# Patient Record
Sex: Male | Born: 1960 | Race: White | Hispanic: No | Marital: Married | Smoking: Former smoker
Health system: Southern US, Community
[De-identification: ages and names within clinical notes are randomized; demographics above are authoritative.]

## PROBLEM LIST (undated history)

## (undated) DIAGNOSIS — D869 Sarcoidosis, unspecified: Secondary | ICD-10-CM

## (undated) DIAGNOSIS — K5190 Ulcerative colitis, unspecified, without complications: Secondary | ICD-10-CM

## (undated) DIAGNOSIS — M459 Ankylosing spondylitis of unspecified sites in spine: Secondary | ICD-10-CM

## (undated) DIAGNOSIS — F319 Bipolar disorder, unspecified: Secondary | ICD-10-CM

## (undated) DIAGNOSIS — E119 Type 2 diabetes mellitus without complications: Secondary | ICD-10-CM

## (undated) DIAGNOSIS — E785 Hyperlipidemia, unspecified: Secondary | ICD-10-CM

## (undated) HISTORY — DX: Bipolar disorder, unspecified: F31.9

## (undated) HISTORY — DX: Ankylosing spondylitis of unspecified sites in spine: M45.9

## (undated) HISTORY — DX: Ulcerative colitis, unspecified, without complications: K51.90

## (undated) HISTORY — DX: Sarcoidosis, unspecified: D86.9

## (undated) HISTORY — PX: BRONCHOSCOPY: SUR163

## (undated) HISTORY — DX: Type 2 diabetes mellitus without complications: E11.9

## (undated) HISTORY — DX: Hyperlipidemia, unspecified: E78.5

---

## 2004-02-29 ENCOUNTER — Encounter: Payer: Self-pay | Admitting: Pulmonary Disease

## 2004-03-14 ENCOUNTER — Encounter: Payer: Self-pay | Admitting: Pulmonary Disease

## 2004-06-02 ENCOUNTER — Encounter: Admission: RE | Admit: 2004-06-02 | Discharge: 2004-06-02 | Payer: Self-pay | Admitting: Rheumatology

## 2004-09-02 ENCOUNTER — Encounter: Admission: RE | Admit: 2004-09-02 | Discharge: 2004-09-02 | Payer: Self-pay | Admitting: Rheumatology

## 2004-09-05 ENCOUNTER — Encounter: Payer: Self-pay | Admitting: Pulmonary Disease

## 2004-09-05 ENCOUNTER — Encounter: Admission: RE | Admit: 2004-09-05 | Discharge: 2004-09-05 | Payer: Self-pay | Admitting: Rheumatology

## 2004-09-19 ENCOUNTER — Ambulatory Visit: Payer: Self-pay | Admitting: Pulmonary Disease

## 2004-09-21 ENCOUNTER — Ambulatory Visit: Admission: RE | Admit: 2004-09-21 | Discharge: 2004-09-21 | Payer: Self-pay | Admitting: Pulmonary Disease

## 2004-09-21 ENCOUNTER — Ambulatory Visit: Payer: Self-pay | Admitting: Pulmonary Disease

## 2004-09-21 ENCOUNTER — Encounter (INDEPENDENT_AMBULATORY_CARE_PROVIDER_SITE_OTHER): Payer: Self-pay | Admitting: Specialist

## 2004-10-12 ENCOUNTER — Ambulatory Visit (HOSPITAL_COMMUNITY): Admission: RE | Admit: 2004-10-12 | Discharge: 2004-10-12 | Payer: Self-pay | Admitting: Thoracic Surgery

## 2004-10-12 ENCOUNTER — Encounter: Payer: Self-pay | Admitting: Pulmonary Disease

## 2004-10-12 ENCOUNTER — Encounter (INDEPENDENT_AMBULATORY_CARE_PROVIDER_SITE_OTHER): Payer: Self-pay | Admitting: *Deleted

## 2004-10-20 ENCOUNTER — Ambulatory Visit: Payer: Self-pay | Admitting: Pulmonary Disease

## 2005-05-22 ENCOUNTER — Ambulatory Visit: Payer: Self-pay | Admitting: Pulmonary Disease

## 2007-02-11 ENCOUNTER — Ambulatory Visit: Payer: Self-pay | Admitting: Pulmonary Disease

## 2007-09-19 ENCOUNTER — Telehealth (INDEPENDENT_AMBULATORY_CARE_PROVIDER_SITE_OTHER): Payer: Self-pay | Admitting: *Deleted

## 2007-10-18 ENCOUNTER — Encounter: Payer: Self-pay | Admitting: Pulmonary Disease

## 2007-10-18 DIAGNOSIS — M459 Ankylosing spondylitis of unspecified sites in spine: Secondary | ICD-10-CM | POA: Insufficient documentation

## 2007-10-18 DIAGNOSIS — K219 Gastro-esophageal reflux disease without esophagitis: Secondary | ICD-10-CM | POA: Insufficient documentation

## 2007-10-18 DIAGNOSIS — L509 Urticaria, unspecified: Secondary | ICD-10-CM | POA: Insufficient documentation

## 2007-11-03 ENCOUNTER — Encounter: Admission: RE | Admit: 2007-11-03 | Discharge: 2007-11-03 | Payer: Self-pay | Admitting: Rheumatology

## 2008-01-30 ENCOUNTER — Encounter: Payer: Self-pay | Admitting: Pulmonary Disease

## 2008-01-30 ENCOUNTER — Ambulatory Visit: Payer: Self-pay | Admitting: Pulmonary Disease

## 2008-01-30 DIAGNOSIS — D869 Sarcoidosis, unspecified: Secondary | ICD-10-CM | POA: Insufficient documentation

## 2008-03-12 ENCOUNTER — Ambulatory Visit: Payer: Self-pay | Admitting: Pulmonary Disease

## 2008-03-15 ENCOUNTER — Encounter: Payer: Self-pay | Admitting: Pulmonary Disease

## 2009-01-20 ENCOUNTER — Observation Stay (HOSPITAL_COMMUNITY): Admission: EM | Admit: 2009-01-20 | Discharge: 2009-01-20 | Payer: Self-pay | Admitting: Emergency Medicine

## 2009-01-20 ENCOUNTER — Encounter: Payer: Self-pay | Admitting: Pulmonary Disease

## 2009-01-20 ENCOUNTER — Ambulatory Visit: Payer: Self-pay | Admitting: Cardiology

## 2009-01-20 IMAGING — CT CT HEART MORP W/ CTA COR W/ SCORE W/ CA W/CM &/OR W/O CM
1 series · 1 of 1 positions shown, 2 images · non-contrast
Comparison: none

Addendum Begins

There is hilar and mediastinal adenopathy, decreased from the prior
CT scan dated 09/05/2004.  There is no discrete nodularity in the
lower lobes.  There were two small small nodules in the left lower
lobe on the prior exam.  The bony structures are normal.  The
visualized portion of the upper abdomen is normal.
There is no pericardial disease.
PROTOCOL: The patient scanned on a Siemens sensations 64 slice
scanner.  Gantry rotation speed was 320 milliseconds.  Collimation
was [DATE] mm . Reconstruction overlap was [DATE] mm.   100
mg p.o. and 10 mg IV  of Lopressor was administered.  Average heart
rate during the scan was  beats per minute.  After an initial AP
and lateral topogram, 3 mm axial slices were performed through the
heart for calcium scoring.  The patient then received 20 ml of
contrast for a timing bolus with a region of interest in the
ascending aorta.  A delay of 20 seconds was used.  The patient then
had  80 ml of contrast given for coronary CTA.  The 3-D data set
was then sent to the [employer] workstation.  Reconstructions were
done using MIP,MPR and VRT modes.

[Series 606: monochrome 8bit · 1 of 1 slices shown, 2 images]
[im 1/1  vessel]
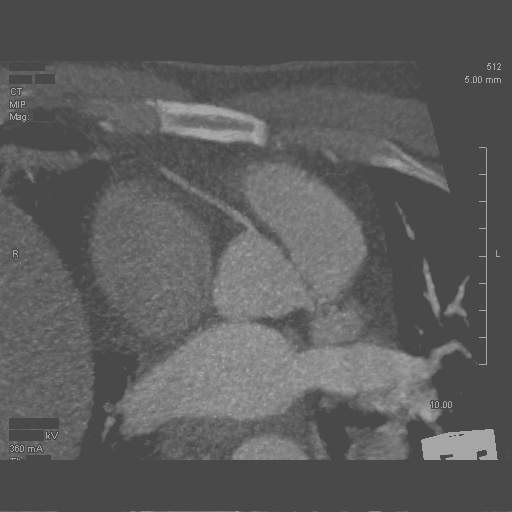
[im 1/1  lung]
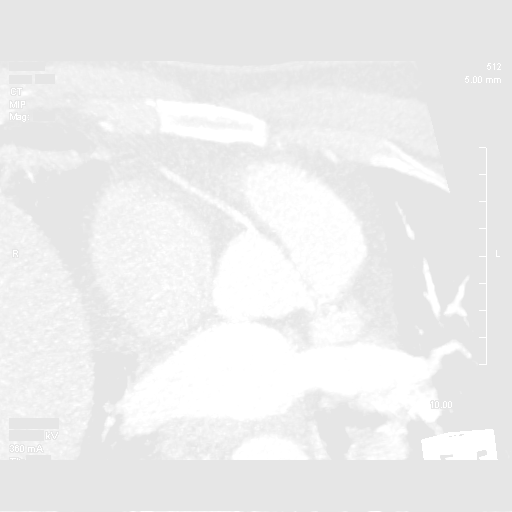

[1 of 1 positions shown; findings below may reference images not displayed]

IMPRESSION: Hilar and mediastinal adenopathy consistent with
sarcoidosis, diminished since the prior study.  Resolution of small
pulmonary nodules at the left lung base since the prior exam.

Addendum Ends
CARDIAC CTA WITH CALCIUM SCORE 01/20/2009 [DATE]

Ordering Physician: Blondinacka

[place] Physician: Higinus.Danii, M.D.
Indications: Chest pain

DETAILED FINDINGS:

Quality of Study: Fair

Left Main: Normal.

Left Anterior Descending: There are no significant stenoses.  There
is some degradation of the images due to pixelation.

Left Circumflex: The circumflex is widely patent with a large
obtuse marginal which is also widely patent.  The circumflex is
dominant and supplies the PDA.

Right Coronary Artery: There are no significant stenoses.  The scan
quality is suboptimal with the pixalation artifact.

Ventricular Function/Wall Motion: Normal.

LV Ejection Fraction: The ejection fraction appears normal.
Calculation software was nonfunctional.

Left Atrium, Right Atrium, RV Size: There is no chamber
enlargement.

Pericardium: Normal.  No pericardial effusion or pericardial
thickening.

Coronary Calcium Score: Agatston Score 5.02, percentile 50-75%

Aorta: The descending thoracic aorta is normal with a maximal
diameter of 2.7 cm.  The descending thoracic aorta has a maximal
diameter of 2.3 cm.

Other: The patient has hilar mediastinal adenopathy.  He has a
history of sarcoidosis and this is felt to be responsible for the
adenopathy.
IMPRESSION: There is no discrete coronary artery disease.  However, the study
is suboptimal due to pixelation of the images.

## 2009-01-22 DIAGNOSIS — R079 Chest pain, unspecified: Secondary | ICD-10-CM | POA: Insufficient documentation

## 2009-01-25 ENCOUNTER — Ambulatory Visit: Payer: Self-pay

## 2009-01-25 ENCOUNTER — Encounter: Payer: Self-pay | Admitting: Cardiology

## 2009-01-26 ENCOUNTER — Ambulatory Visit: Payer: Self-pay | Admitting: Pulmonary Disease

## 2009-04-20 ENCOUNTER — Ambulatory Visit: Payer: Self-pay | Admitting: Cardiology

## 2010-10-01 LAB — POCT CARDIAC MARKERS
CKMB, poc: <1 ng/mL — ABNORMAL LOW (ref 1.0–8.0)
CKMB, poc: <1 ng/mL — ABNORMAL LOW (ref 1.0–8.0)
CKMB, poc: <1 ng/mL — ABNORMAL LOW (ref 1.0–8.0)
Myoglobin, poc: 35.3 ng/mL (ref 12–200)
Myoglobin, poc: 53.4 ng/mL (ref 12–200)
Myoglobin, poc: 76.7 ng/mL (ref 12–200)
Troponin i, poc: <0.05 ng/mL (ref 0.00–0.09)
Troponin i, poc: <0.05 ng/mL (ref 0.00–0.09)
Troponin i, poc: <0.05 ng/mL (ref 0.00–0.09)

## 2010-10-01 LAB — DIFFERENTIAL
Basophils Absolute: 0.1 10*3/uL (ref 0.0–0.1)
Basophils Relative: 1 % (ref 0–1)
Eosinophils Absolute: 0.3 10*3/uL (ref 0.0–0.7)
Eosinophils Relative: 3 % (ref 0–5)
Lymphocytes Relative: 14 % (ref 12–46)
Lymphs Abs: 1.3 10*3/uL (ref 0.7–4.0)
Monocytes Absolute: 0.7 10*3/uL (ref 0.1–1.0)
Monocytes Relative: 7 % (ref 3–12)
Neutro Abs: 6.9 10*3/uL (ref 1.7–7.7)
Neutrophils Relative %: 75 % (ref 43–77)

## 2010-10-01 LAB — BASIC METABOLIC PANEL
BUN: 21 mg/dL (ref 6–23)
CO2: 26 mEq/L (ref 19–32)
Calcium: 9.4 mg/dL (ref 8.4–10.5)
Chloride: 101 mEq/L (ref 96–112)
Creatinine, Ser: 1.14 mg/dL (ref 0.4–1.5)
GFR calc Af Amer: >60 mL/min (ref >60)
GFR calc non Af Amer: >60 mL/min (ref >60)
Glucose, Bld: 138 mg/dL — ABNORMAL HIGH (ref 70–99)
Potassium: 3.8 mEq/L (ref 3.5–5.1)
Sodium: 135 mEq/L (ref 135–145)

## 2010-10-01 LAB — CBC
HCT: 41.6 % (ref 39.0–52.0)
Hemoglobin: 14.2 g/dL (ref 13.0–17.0)
MCHC: 34.2 g/dL (ref 30.0–36.0)
MCV: 92.8 fL (ref 78.0–100.0)
Platelets: 304 10*3/uL (ref 150–400)
RBC: 4.48 MIL/uL (ref 4.22–5.81)
RDW: 12.8 % (ref 11.5–15.5)
WBC: 9.2 10*3/uL (ref 4.0–10.5)

## 2010-10-01 LAB — D-DIMER, QUANTITATIVE: D-Dimer, Quant: 0.24 ug/mL-FEU (ref 0.00–0.48)

## 2010-11-07 NOTE — Consult Note (Signed)
NAME:  Dale Curtis, Dale Curtis               ACCOUNT NO.:  [id number]   MEDICAL RECORD NO.:  [id number]          PATIENT TYPE:  OBV   LOCATION:  1858                         FACILITY:  [facility]   PHYSICIAN:  Arturo Morton. Riley Kill, MD, FACCDATE OF BIRTH:  [birth year]   DATE OF CONSULTATION:  01/20/2009  DATE OF DISCHARGE:                                 CONSULTATION   PRIMARY CARE PHYSICIAN:  Feliciana Rossetti, MD   PULMONOLOGIST:  Barbaraann Share, MD,FCCP   CARDIOLOGIST:  None.   CHIEF COMPLAINT:  Chest pain.   HISTORY OF PRESENT ILLNESS:  Mr. Brathwaite is a 50 year old male with no  previous history of coronary artery disease.  He had chest pain several  years ago and had a stress test in [place] that was reportedly okay.  He was in his usual state of health until December 25, 2008.  He took his  usual weekly shot of Enbrel.  Later that day while traveling, he had  chest pain that he described as cramping and tightness.  It reached a  3/10.  He had some nausea with it, but no shortness of breath, vomiting,  or diaphoresis.  The symptoms resolved spontaneously.  Over the weekend,  he had 3 or 4 episodes.  He started taking Tums for the last few  episodes, which helped.  The symptoms resolved completely until the January 18, 2009.  When his symptoms returned on January 18, 2009, he saw his  primary care physician who sent him to the ER at [facility].  There, his EKG was normal and cardiac enzymes were also normal.  Those  symptoms resolved spontaneously.  The symptoms returned yesterday.  He  had been out on the yard doing work, but feels that the activity was not  very strenuous.  Although, it was hot and he was outside for a prolonged  period of time.  He had no symptoms.  He ate part of a piece of chicken  and drank a Coke after he came back inside and cooled off.  His symptoms  returned at 3/10.  He came to the [facility] ER where he began belching  and stated that the belching relieved the chest pain.   Then, he  developed burning chest pain, which was relieved by GI cocktail.  He has  remained symptom-free.  He has had no exertional symptoms and feels that  he does exert himself on a regular basis through work and sports.   PAST MEDICAL HISTORY:  He has family history of coronary artery disease  and a remote history of tobacco use.  He has pulmonary sarcoid, but PFTs  were normal in September 2009.  Several years ago, he had a stress test  at [facility] because of chest pain that was reportedly okay.  He also has a history of ankylosing spondylitis.  He has a history of  gastroesophageal reflux disease and that was chest tightness which  resolved when he quit coffee several years ago.   SURGICAL HISTORY:  Status post mediastinoscopy and bronchoscopy.   ALLERGIES:  No known drug allergies.  MEDICATIONS:  Enbrel weekly.   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  He has an  approximately 10-pack-a-year history of tobacco use and quit in 2006.  He has no history of alcohol or drug abuse.  He exercises regularly with  wrestling and other activities and works around the house.   FAMILY HISTORY:  His mother died at 26 with COPD, but also had an MI.  His father is alive at age 73 with a history of coronary artery disease  and AAA.  He has no siblings with cardiac issues.   REVIEW OF SYSTEMS:  Full 14-point review of systems is negative except  for occasional arthralgias and reflux symptoms as well as the symptoms  described in the HPI.   PHYSICAL EXAMINATION:  VITAL SIGNS:  Temperature is 98, blood pressure  118/75, pulse 61, respiratory rate 18, O2 saturation 98% on room air.  GENERAL:  He is a well-developed, well-nourished white male in no acute  distress.  HEENT:  Normal.  Neck:  There is no lymphadenopathy, thyromegaly, bruit, or JVD noted.  CVA:  His heart is regular in rate and  rhythm with an S1 and S2 and no  significant murmur, rub, or gallop is noted.  Distal pulses are intact  in all four extremities.  LUNGS:  Essentially clear to auscultation bilaterally.  SKIN:  No rashes or lesions are noted.  ABDOMEN:  Soft and nontender with active bowel sounds.  EXTREMITIES:  There is no cyanosis, clubbing, or edema noted.  MUSCULOSKELETAL:  There is no joint deformity or effusions and no spine  or CVA tenderness.  NEUROLOGIC:  He is alert and oriented.  Cranial nerves II-XII grossly  intact.   Chest x-ray shows stable chest with sarcoid as previously noted.   CT angiogram of the chest is equivocal for coronary artery disease.   EKG:  Sinus tachycardia, rate 116 with no acute ischemic changes.  Repeat EKG is sinus rhythm, rate 67 with possible early repol, but no  acute ischemic changes.   LABORATORY VALUES:  D-dimer 0.24.  Point-of-care markers negative x3.  Sodium 135, potassium 3.8, chloride 101, CO2 of 26, BUN 21, creatinine  1.14, glucose 138 (nonfasting).  Hemoglobin 14.2, hematocrit 41.6, WBC  9.2, platelets 304.   IMPRESSION:  Mr. Dettman was seen today by Dr. Riley Kill.  His enzymes are  negative x3 and his EKG is unremarkable.  Mr. Stoffers was seen and  examined by Dr. Riley Kill and the case was discussed with Dr. Jena Gauss.  His calcium score is 5 and they did not see much, but the scan was  equivocal because of the radiation protocol.  He has had reflux  documented.  Symptoms are not exertional and he drinks coffee.  The  recommendation is to decrease  activity until a stress echocardiogram, which has been scheduled for  next Tuesday.  He will follow up with Dr. Riley Kill afterwards and with  his primary care physician and pulmonologist p.r.n.  His stress echo was  scheduled for 3 p.m. on Tuesday, January 25, 2009, and his followup with  Dr. Riley Kill is at 11:15 on February 03, 2009.      Theodore Demark, PA-C      Arturo Morton. Riley Kill, MD, [facility]   Electronically Signed    RB/MEDQ  D:  01/20/2009  T:  01/21/2009  Job:  [id number]   cc:   Feliciana Rossetti,  MD

## 2010-11-10 NOTE — Op Note (Signed)
[name], Dale Curtis               ACCOUNT NO.:  [id number]   MEDICAL RECORD NO.:  [id number]          PATIENT TYPE:  OIB   LOCATION:  2899                         FACILITY:  [facility]   PHYSICIAN:  Ines Bloomer, M.D. DATE OF BIRTH:  05/03/1961   DATE OF PROCEDURE:  10/12/2004  DATE OF DISCHARGE:                                 OPERATIVE REPORT   PREOPERATIVE DIAGNOSES:  1.  Mediastinal adenopathy.  2.  Ankylosing spondylitis.   POSTOPERATIVE DIAGNOSES:  1.  Probable sarcoidosis.  2.  Ankylosing spondylitis.   OPERATION PERFORMED:  Mediastinoscopy.   SURGEON:  Ines Bloomer, M.D.   ANESTHESIA:  General anesthesia.   DESCRIPTION OF PROCEDURE:  After general anesthesia, the neck was prepped  and draped in the usual sterile manner.  A transverse incision was made and  this was carried down with electrocautery through subcutaneous tissue and  fascia.  The pretracheal fascia was entered and a mediastinoscope was  inserted and biopsies of 4-R nodes were done x2.  Frozen section revealed  granulomatous inflammation consistent with sarcoid.  The strap muscles were  closed with 2-0 Vicryl, subcutaneous tissue with 3-0 Vicryl and Dermabond to  the skin.  The patient returned to the recovery room in stable condition.      DPB/MEDQ  D:  10/12/2004  T:  10/12/2004  Job:  [id number]   cc:   Marcelyn [place], M.D. [facility]

## 2010-11-10 NOTE — Op Note (Signed)
NAME:  Dale Curtis, Dale Curtis               ACCOUNT NO.:  [id number]   MEDICAL RECORD NO.:  [id number]          PATIENT TYPE:  OUT   LOCATION:  CARD                         FACILITY[facility]  [facility]   PHYSICIAN:  Marcelyn [place], M.D. [facility] DATE OF BIRTH:  [birth year]   DATE OF PROCEDURE:  09/21/2004  DATE OF DISCHARGE:                                 OPERATIVE REPORT   PROCEDURE:  Flexible fiberoptic bronchoscopy with transbronchial lung  biopsy.   INDICATION:  Mediastinal lymphadenopathy of unknown etiology.  Rule out  sarcoidosis.   OPERATOR:  Marcelyn [place], M.D. [facility].   ANESTHESIA:  1.  Demerol 100 mg IV.  2.  Versed 15 mg IV in various aliquots.  3.  Topical 1% lidocaine to cords and airways during the procedure.   DESCRIPTION:  After obtaining informed consent, and under close  cardiopulmonary monitoring, the above preop anesthesia, and the fiberoptic  scope was passed through the left naris and into the posterior pharynx, with  lesions or other abnormalities seen.  The vocal cords appeared to be within  normal limits and moved bilaterally on phonation.  The scope was then passed  into the trachea, where it was examined along its entire length down to the  level of the carina, all of which was normal.  The left and right  tracheobronchial trees were examined serially to the subsegmental level,  with no endobronchial abnormality being found.  Attention was then paid to  the right lower lobe bronchus, where transbronchial biopsies x 4 were taken  under fluoroscopic guidance, with excellent material being obtained.  Good  hemostasis was maintained throughout the procedure.  Fluoroscopy was used to  check quickly for pneumothorax post biopsy, with none being seen.  A chest x-  ray will be done to formally rule out pneumothorax post biopsy.  Overall,  the patient tolerated the procedure well, and there were no complications.      KC/MEDQ  D:  09/21/2004  T:  09/21/2004  Job:  [id number]   cc:   Pollyann Savoy, M.D.  [address]  [place], [place] [address]  Fax: [phone]

## 2012-06-25 HISTORY — PX: HERNIA REPAIR: SHX51

## 2014-10-06 ENCOUNTER — Other Ambulatory Visit: Payer: Self-pay | Admitting: Family Medicine

## 2014-10-06 ENCOUNTER — Ambulatory Visit (INDEPENDENT_AMBULATORY_CARE_PROVIDER_SITE_OTHER): Payer: BLUE CROSS/BLUE SHIELD

## 2014-10-06 DIAGNOSIS — IMO0001 Reserved for inherently not codable concepts without codable children: Secondary | ICD-10-CM

## 2014-10-06 DIAGNOSIS — Z1389 Encounter for screening for other disorder: Secondary | ICD-10-CM

## 2014-10-06 DIAGNOSIS — Z8249 Family history of ischemic heart disease and other diseases of the circulatory system: Secondary | ICD-10-CM | POA: Diagnosis not present

## 2016-04-03 ENCOUNTER — Ambulatory Visit: Payer: Self-pay | Admitting: Rheumatology

## 2016-05-18 ENCOUNTER — Other Ambulatory Visit: Payer: Self-pay | Admitting: Rheumatology

## 2016-05-23 ENCOUNTER — Other Ambulatory Visit: Payer: Self-pay | Admitting: Rheumatology

## 2016-05-23 NOTE — Telephone Encounter (Signed)
Please review sig, you normally prescribe flexeril only at hs, he has been getting tid dosing, was last refilled in office visit 10/04/15,you have had him on this dose for several years.   He missed his last visit, will only send in one month supply if you agree with sig, have sent message to front desk to [facility] appointment

## 2016-05-23 NOTE — Telephone Encounter (Signed)
Ok to refill till fu.Will like to taper.

## 2016-06-20 DIAGNOSIS — M461 Sacroiliitis, not elsewhere classified: Secondary | ICD-10-CM | POA: Insufficient documentation

## 2016-06-20 DIAGNOSIS — M47812 Spondylosis without myelopathy or radiculopathy, cervical region: Secondary | ICD-10-CM | POA: Insufficient documentation

## 2016-06-20 DIAGNOSIS — M19041 Primary osteoarthritis, right hand: Secondary | ICD-10-CM | POA: Insufficient documentation

## 2016-06-20 DIAGNOSIS — Z1589 Genetic susceptibility to other disease: Secondary | ICD-10-CM | POA: Insufficient documentation

## 2016-06-20 DIAGNOSIS — M19042 Primary osteoarthritis, left hand: Secondary | ICD-10-CM

## 2016-06-20 DIAGNOSIS — M5134 Other intervertebral disc degeneration, thoracic region: Secondary | ICD-10-CM | POA: Insufficient documentation

## 2016-06-20 DIAGNOSIS — M47816 Spondylosis without myelopathy or radiculopathy, lumbar region: Secondary | ICD-10-CM | POA: Insufficient documentation

## 2016-06-20 DIAGNOSIS — H209 Unspecified iridocyclitis: Secondary | ICD-10-CM | POA: Insufficient documentation

## 2016-06-20 NOTE — Progress Notes (Signed)
Office Visit Note  Patient: Dale Curtis             Date of Birth: [birth year]           MRN: [id number]             PCP: Amador Cunas, [place] Referring: No ref. provider found Visit Date: 06/21/2016 Occupation: @GUAROCC @    Subjective:  Follow-up (fingers stiff otherwise well) and Medication Refill (enbrel and flexeril)   History of Present Illness: Dale Curtis is a 55 y.o. male  Last seen 10/04/2015. History of ankylosing spondylitis. No flares. Doing well with sacroiliitis and uveitis. (In remission). Taking Enbrel every week as prescribed.  History of DDD of the C, T, L spine. Some ongoing stiffness but doing well overall.  Gets his labs from the PCP (has TB Vial in office for the patient so he can get TB gold on there).  Patient is due for labs now and we will do labs today in office.   Patient uses Flexeril when necessary daily at bedtime. In past, we have prescribed 3 times a day dosing but patient does not require that. We will amend his prescription to reflect daily at bedtime dosing. He is agreeable.   Activities of Daily Living:  Patient reports morning stiffness for 15 minutes.   Patient Denies nocturnal pain.  Difficulty dressing/grooming: Denies Difficulty climbing stairs: Denies Difficulty getting out of chair: Denies Difficulty using hands for taps, buttons, cutlery, and/or writing: Denies   Review of Systems  Constitutional: Negative for fatigue.  HENT: Negative for mouth sores and mouth dryness.   Eyes: Negative for dryness.  Respiratory: Negative for shortness of breath.   Gastrointestinal: Negative for constipation and diarrhea.  Musculoskeletal: Negative for myalgias and myalgias.  Skin: Negative for sensitivity to sunlight.  Neurological: Negative for memory loss.  Psychiatric/Behavioral: Negative for sleep disturbance.    PMFS History:  Patient Active Problem List   Diagnosis Date Noted  . Sacroiliitis ([place]) 06/20/2016  . Uveitis  06/20/2016  . DJD (degenerative joint disease), cervical 06/20/2016  . 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 Primary osteoarthritis of both hands 06/20/2016  . HLA B27 (HLA B27 positive) 06/20/2016  . CHEST PAIN 01/22/2009  . SARCOIDOSIS, PULMONARY 01/30/2008  . GERD 10/18/2007  . URTICARIA 10/18/2007  . SPONDYLITIS, ANKYLOSING 10/18/2007    No past medical history on file.  No family history on file. No past surgical history on file. Social History   Social History Narrative  . No narrative on file     Objective: Vital Signs: BP 122/78   Pulse 84   Resp 16   Ht 5' 11"  (1.803 m)   Wt 198 lb (89.8 kg)   BMI 27.62 kg/m    Physical Exam  Constitutional: He is oriented to person, place, and time. He appears well-developed and well-nourished.  HENT:  Head: Normocephalic and atraumatic.  Eyes: Conjunctivae and EOM are normal. Pupils are equal, round, and reactive to light.  Neck: Normal range of motion. Neck supple.  Cardiovascular: Normal rate, regular rhythm and normal heart sounds.  Exam reveals no gallop and no friction rub.   No murmur heard. Pulmonary/Chest: Effort normal and breath sounds normal. No respiratory distress. He has no wheezes. He has no rales. He exhibits no tenderness.  Abdominal: Soft. He exhibits no distension and no mass. There is no tenderness. There is no guarding.  Musculoskeletal: Normal range  of motion.  Lymphadenopathy:    He has no cervical adenopathy.  Neurological: He is alert and oriented to person, place, and time. He exhibits normal muscle tone. Coordination normal.  Skin: Skin is warm and dry. Capillary refill takes less than 2 seconds. No rash noted.  Psychiatric: He has a normal mood and affect. His behavior is normal. Judgment and thought content normal.  Nursing note and vitals reviewed.    Musculoskeletal Exam:  Full range of motion of  all joints Grip strength is equal and strong bilaterally Fiber myalgia tender points are all absent  CDAI Exam: CDAI Homunculus Exam:   Joint Counts:  CDAI Tender Joint count: 0 CDAI Swollen Joint count: 0  Global Assessments:  Patient Global Assessment: 0 Provider Global Assessment: 0  Good flexion of the lumbar spine. No SI joint pain No synovitis Doing well with ankylosing spondylitis.  OA of the hands with DIP PIP prominence bilaterally.  Investigation: No additional findings. Patient's last labs are dated 10/07/2015. CBC with differential and CMP with GFR were normal on that visit. TB gold was negative.  Imaging: No results found.  Speciality Comments: No specialty comments available.    Procedures:  No procedures performed Allergies: Patient has no known allergies.   Assessment / Plan:     Visit Diagnoses: Ankylosing spondylitis of multiple sites in spine ([place])  Sarcoidosis ([place])  Sacroiliitis ([place])  Uveitis  DJD (degenerative joint disease), cervical  Spondylosis of lumbar region without myelopathy or radiculopathy  DDD (degenerative disc disease), thoracic  Primary osteoarthritis of both hands  HLA B27 (HLA B27 positive)  High risk medications (not anticoagulants) long-term use - Plan: CBC with Differential/Platelet, COMPLETE METABOLIC PANEL WITH GFR, Quantiferon tb gold assay (blood), CBC with Differential/Platelet, COMPLETE METABOLIC PANEL WITH GFR, Quantiferon tb gold assay (blood), CBC with Differential/Platelet, COMPLETE METABOLIC PANEL WITH GFR   Plan: #1: Ankylosing spondylitis is doing well with his current treatment of Enbrel every week. Patient is compliant with his medication however he is not getting his labs done every 3 months as required. I encouraged him to get the labs done every 3 months and he is agreeable. His next labs will be due approximately March 2018 and he plans to get it done through his PCPs office who is also able to  draw his TB gold which she will need March 2018.  #2: Patient is been getting Flexeril on a 3 times a day dosing. I've encouraged the patient to use daily at bedtime when necessary. He has been doing this. As a result I'll change his prescription to reflect what he's been doing which includes 10 mg daily at bedtime; ninety-day supply with 1 refill.  #3: Refill Enbrel once a week; dispense 90 day supply  #4: Ongoing pain from OA of the hands. Some stiffness.  #5: Return to clinic in 6 months. At his next visit I'll do CBC with differential CMP with GFR in the office.   Orders: Orders Placed This Encounter  Procedures  . CBC with Differential/Platelet  . COMPLETE METABOLIC PANEL WITH GFR  . Quantiferon tb gold assay (blood)  . CBC with Differential/Platelet  . COMPLETE METABOLIC PANEL WITH GFR   Meds ordered this encounter  Medications  . cyclobenzaprine (FLEXERIL) 10 MG tablet    Sig: Take 1 tablet (10 mg total) by mouth at bedtime.    Dispense:  90 tablet    Refill:  1  . etanercept (ENBREL SURECLICK) 50 MG/ML injection    Sig: Inject  0.98 mLs (50 mg total) into the skin once a week.    Dispense:  11.76 mL    Refill:  0    Face-to-face time spent with patient was 30 minutes. 50% of time was spent in counseling and coordination of care.  Follow-Up Instructions: Return in about 6 months (around 12/20/2016) for AS, enbrel, ddd C-T-L, oa HANDS,.   Eliezer Lofts, PA-C

## 2016-06-21 ENCOUNTER — Encounter: Payer: Self-pay | Admitting: Rheumatology

## 2016-06-21 ENCOUNTER — Ambulatory Visit (INDEPENDENT_AMBULATORY_CARE_PROVIDER_SITE_OTHER): Payer: BLUE CROSS/BLUE SHIELD | Admitting: Rheumatology

## 2016-06-21 VITALS — BP 122/78 | HR 84 | Resp 16 | Ht 71.0 in | Wt 198.0 lb

## 2016-06-21 DIAGNOSIS — Z1589 Genetic susceptibility to other disease: Secondary | ICD-10-CM

## 2016-06-21 DIAGNOSIS — M47816 Spondylosis without myelopathy or radiculopathy, lumbar region: Secondary | ICD-10-CM

## 2016-06-21 DIAGNOSIS — M19041 Primary osteoarthritis, right hand: Secondary | ICD-10-CM

## 2016-06-21 DIAGNOSIS — Z79899 Other long term (current) drug therapy: Secondary | ICD-10-CM

## 2016-06-21 DIAGNOSIS — M19042 Primary osteoarthritis, left hand: Secondary | ICD-10-CM

## 2016-06-21 DIAGNOSIS — M5134 Other intervertebral disc degeneration, thoracic region: Secondary | ICD-10-CM

## 2016-06-21 DIAGNOSIS — M5030 Other cervical disc degeneration, unspecified cervical region: Secondary | ICD-10-CM | POA: Diagnosis not present

## 2016-06-21 DIAGNOSIS — M461 Sacroiliitis, not elsewhere classified: Secondary | ICD-10-CM

## 2016-06-21 DIAGNOSIS — M450 Ankylosing spondylitis of multiple sites in spine: Secondary | ICD-10-CM | POA: Diagnosis not present

## 2016-06-21 DIAGNOSIS — D869 Sarcoidosis, unspecified: Secondary | ICD-10-CM | POA: Diagnosis not present

## 2016-06-21 DIAGNOSIS — H209 Unspecified iridocyclitis: Secondary | ICD-10-CM | POA: Diagnosis not present

## 2016-06-21 DIAGNOSIS — M47812 Spondylosis without myelopathy or radiculopathy, cervical region: Secondary | ICD-10-CM

## 2016-06-21 LAB — CBC WITH DIFFERENTIAL/PLATELET
BASOS PCT: 1 %
Basophils Absolute: 84 cells/uL (ref 0–200)
EOS ABS: 252 {cells}/uL (ref 15–500)
EOS PCT: 3 %
HCT: 44.4 % (ref 38.5–50.0)
Hemoglobin: 15.2 g/dL (ref 13.2–17.1)
LYMPHS PCT: 16 %
Lymphs Abs: 1344 cells/uL (ref 850–3900)
MCH: 31.3 pg (ref 27.0–33.0)
MCHC: 34.2 g/dL (ref 32.0–36.0)
MCV: 91.5 fL (ref 80.0–100.0)
MONOS PCT: 9 %
MPV: 9.6 fL (ref 7.5–12.5)
Monocytes Absolute: 756 cells/uL (ref 200–950)
Neutro Abs: 5964 cells/uL (ref 1500–7800)
Neutrophils Relative %: 71 %
PLATELETS: 414 10*3/uL — AB (ref 140–400)
RBC: 4.85 MIL/uL (ref 4.20–5.80)
RDW: 13.6 % (ref 11.0–15.0)
WBC: 8.4 10*3/uL (ref 3.8–10.8)

## 2016-06-21 MED ORDER — ETANERCEPT 50 MG/ML ~~LOC~~ SOAJ: 50.0000 mg | SUBCUTANEOUS | 0 refills | Status: DC

## 2016-06-21 MED ORDER — CYCLOBENZAPRINE HCL 10 MG PO TABS: 10.0000 mg | ORAL_TABLET | Freq: Every day | ORAL | 1 refills | Status: AC

## 2016-06-21 NOTE — Patient Instructions (Signed)
CBC with differential, CMP with GFR, TB gold due March 2018.

## 2016-06-22 LAB — COMPLETE METABOLIC PANEL WITH GFR
ALT: 53 U/L — AB (ref 9–46)
AST: 24 U/L (ref 10–35)
Albumin: 4.4 g/dL (ref 3.6–5.1)
Alkaline Phosphatase: 50 U/L (ref 40–115)
BUN: 11 mg/dL (ref 7–25)
CHLORIDE: 102 mmol/L (ref 98–110)
CO2: 25 mmol/L (ref 20–31)
Calcium: 10.1 mg/dL (ref 8.6–10.3)
Creat: 0.91 mg/dL (ref 0.70–1.33)
Glucose, Bld: 70 mg/dL (ref 65–99)
Potassium: 4.5 mmol/L (ref 3.5–5.3)
SODIUM: 138 mmol/L (ref 135–146)
Total Bilirubin: 0.6 mg/dL (ref 0.2–1.2)
Total Protein: 7.2 g/dL (ref 6.1–8.1)

## 2016-06-25 NOTE — Progress Notes (Signed)
Tell patient#1: CMP with GFR is within normal limits except for ALT IS Mildly elevated.#2: CBC with differential is within normal limits. #3: Send copy of labs to PCP. #4: We can monitor the ALT and future labs but it is nothing to be concerned about at this time. Avoid items I can elevate liver functions like Tylenol or alcohol if appropriate.

## 2016-10-26 ENCOUNTER — Telehealth: Payer: Self-pay

## 2016-10-26 NOTE — Telephone Encounter (Signed)
Received a faxed prior authorization request from [employer]. Filled out authorization and faxed back to insurance. Will update once we receive a response.   Case ID: [id number] Phone: [phone] Fax: [phone]  Abran DukeHopkins, [name] 10:23 AM

## 2016-10-30 NOTE — Telephone Encounter (Signed)
Received a fax from [employer] regarding a prior authorization approval for Enbrel from 09/26/16 to 10/26/17.   Reference [id number] Phone [phone]  Will send document to scan center.  Spoke to patient who voiced understanding and denied any questions regarding his medication.   Keyanah Kozicki, [place], CPhT  2:31 PM

## 2016-12-18 ENCOUNTER — Other Ambulatory Visit: Payer: Self-pay | Admitting: Rheumatology

## 2016-12-18 NOTE — Telephone Encounter (Signed)
Last Visit: 06/21/16 Next Visit is due June 2018. Message sent to the front to schedule patient. Labs: 06/21/16 ALT 53 previously normal  TB Gold: 10/07/15 Neg  Left message for patient to call the office. Patient is overdue for labs and is due for follow up appointment.

## 2016-12-19 NOTE — Telephone Encounter (Signed)
Spoke with patient's wife and she will have patient contact the office to have lab work done and to schedule an appointment.

## 2016-12-24 NOTE — Telephone Encounter (Signed)
Last Visit: 06/21/16 Next Visit is due June 2018. Message sent to the front to schedule patient. Labs: 06/21/16 ALT 53 previously normal  TB Gold: 10/07/15 Neg  Left message for patient to call the office. Patient is overdue for labs and is due for follow up appointment.   Attempted to contact the patient multiple times without success and have left messages on voicemail and with his wife.   Okay to refill 30 supply of Enbrel?

## 2017-01-31 ENCOUNTER — Other Ambulatory Visit: Payer: Self-pay | Admitting: *Deleted

## 2017-01-31 DIAGNOSIS — Z79899 Other long term (current) drug therapy: Secondary | ICD-10-CM

## 2017-01-31 LAB — COMPLETE METABOLIC PANEL WITH GFR
ALT: 25 U/L (ref 9–46)
AST: 14 U/L (ref 10–35)
Albumin: 4.2 g/dL (ref 3.6–5.1)
Alkaline Phosphatase: 66 U/L (ref 40–115)
BUN: 12 mg/dL (ref 7–25)
CALCIUM: 10 mg/dL (ref 8.6–10.3)
CHLORIDE: 101 mmol/L (ref 98–110)
CO2: 24 mmol/L (ref 20–32)
CREATININE: 0.94 mg/dL (ref 0.70–1.33)
GFR, Est African American: >89 mL/min (ref >=60)
GFR, Est Non African American: >89 mL/min (ref >=60)
Glucose, Bld: 148 mg/dL — ABNORMAL HIGH (ref 65–99)
Potassium: 4.9 mmol/L (ref 3.5–5.3)
Sodium: 138 mmol/L (ref 135–146)
Total Bilirubin: 0.5 mg/dL (ref 0.2–1.2)
Total Protein: 6.9 g/dL (ref 6.1–8.1)

## 2017-01-31 LAB — CBC WITH DIFFERENTIAL/PLATELET
BASOS PCT: 1 %
Basophils Absolute: 82 cells/uL (ref 0–200)
Eosinophils Absolute: 328 cells/uL (ref 15–500)
Eosinophils Relative: 4 %
HEMATOCRIT: 44.3 % (ref 38.5–50.0)
HEMOGLOBIN: 14.8 g/dL (ref 13.2–17.1)
LYMPHS ABS: 1476 {cells}/uL (ref 850–3900)
LYMPHS PCT: 18 %
MCH: 30 pg (ref 27.0–33.0)
MCHC: 33.4 g/dL (ref 32.0–36.0)
MCV: 89.7 fL (ref 80.0–100.0)
MONO ABS: 738 {cells}/uL (ref 200–950)
MPV: 9.4 fL (ref 7.5–12.5)
Monocytes Relative: 9 %
Neutro Abs: 5576 cells/uL (ref 1500–7800)
Neutrophils Relative %: 68 %
Platelets: 445 10*3/uL — ABNORMAL HIGH (ref 140–400)
RBC: 4.94 MIL/uL (ref 4.20–5.80)
RDW: 13.8 % (ref 11.0–15.0)
WBC: 8.2 10*3/uL (ref 3.8–10.8)

## 2017-01-31 NOTE — Progress Notes (Signed)
Glu elevated

## 2017-05-08 NOTE — Progress Notes (Signed)
Office Visit Note  Patient: Dale Curtis             Date of Birth: [birth year]           MRN: [id number]             PCP: Amador Cunas, FNP Referring: Amador Cunas, FNP Visit Date: 05/09/2017 Occupation: @GUAROCC @    Subjective:  Neck stiffness   History of Present Illness: AARSH FRISTOE is a 56 y.o. male with history of ankylosing spondylitis and sarcoidosis. He states he continues to have some discomfort in his lower back off and on. Recently his issue is illness C-spine. He had a colonoscopy in October by her nurse full GI. According to patient he was diagnosed with some inflammation and was given Lialda.He does not know the diagnosis.he denies any shortness of breath or flare of sarcoidosis. He is not followed by pulmonologist anymore.  Activities of Daily Living:  Patient reports morning stiffness for 0 minute.   Patient Denies nocturnal pain.  Difficulty dressing/grooming: Denies Difficulty climbing stairs: Denies Difficulty getting out of chair: Denies Difficulty using hands for taps, buttons, cutlery, and/or writing: Denies   Review of Systems  Constitutional: Positive for fatigue. Negative for night sweats and weakness ( ).  HENT: Negative for mouth sores, mouth dryness and nose dryness.   Eyes: Negative for redness and dryness.  Respiratory: Negative for shortness of breath and difficulty breathing.   Cardiovascular: Negative for chest pain, palpitations, hypertension, irregular heartbeat and swelling in legs/feet.  Gastrointestinal: Negative for constipation and diarrhea.  Endocrine: Negative for increased urination.  Musculoskeletal: Positive for arthralgias and joint pain. Negative for joint swelling, myalgias, muscle weakness, morning stiffness, muscle tenderness and myalgias.  Skin: Negative for color change, rash, hair loss, nodules/bumps, skin tightness, ulcers and sensitivity to sunlight.  Allergic/Immunologic: Negative for susceptible to infections.    Neurological: Negative for dizziness, fainting, memory loss and night sweats.  Hematological: Negative for swollen glands.  Psychiatric/Behavioral: Positive for depressed mood. Negative for sleep disturbance. The patient is nervous/anxious.     PMFS History:  Patient Active Problem List   Diagnosis Date Noted  . History of bipolar disorder 05/09/2017  . Sacroiliitis ([place]) 06/20/2016  . Uveitis 06/20/2016  . DJD (degenerative joint disease), cervical 06/20/2016  . 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 Primary osteoarthritis of both hands 06/20/2016  . HLA B27 (HLA B27 positive) 06/20/2016  . CHEST PAIN 01/22/2009  . SARCOIDOSIS, PULMONARY 01/30/2008  . GERD 10/18/2007  . URTICARIA 10/18/2007  . SPONDYLITIS, ANKYLOSING 10/18/2007    History reviewed. No pertinent past medical history.  Family History  Problem Relation Age of Onset  . COPD Mother   . Emphysema Mother   . Heart disease Mother   . Heart disease Father   . COPD Father   . Bipolar disorder Sister   . Ankylosing spondylitis Daughter    Past Surgical History:  Procedure Laterality Date  . BRONCHOSCOPY    . HERNIA REPAIR  2014   Social History   Social History Narrative  . Not on file     Objective: Vital Signs: BP (!) 132/91 (BP Location: Left Arm, Patient Position: Sitting, Cuff Size: Normal)   Pulse 84   Resp 18   Ht 5' 11"  (1.803 m)   Wt 200 lb (90.7 kg)   BMI 27.89 kg/m    Physical Exam  Constitutional: He is oriented to  person, place, and time. He appears well-developed and well-nourished.  HENT:  Head: Normocephalic and atraumatic.  Eyes: Conjunctivae and EOM are normal. Pupils are equal, round, and reactive to light.  Neck: Normal range of motion. Neck supple.  Cardiovascular: Normal rate, regular rhythm and normal heart sounds.  Pulmonary/Chest: Effort normal and breath sounds normal.   Abdominal: Soft. Bowel sounds are normal.  Neurological: He is alert and oriented to person, place, and time.  Skin: Skin is warm and dry. Capillary refill takes less than 2 seconds.  Psychiatric: He has a normal mood and affect. His behavior is normal.  Nursing note and vitals reviewed.    Musculoskeletal Exam: c-spine some limitation with range of motion. Thoracic and lumbar spine fairly good range of motion. No SI joint tenderness. Shoulder joints elbow joints wrist joint MCPs PIPs DIPs with good range of motion with no synovitis.Hip joints knee joints ankles MTPs PIPs with good range of motion with no synovitis. No Achillis tendinitis was noted.  CDAI Exam: CDAI Homunculus Exam:   Joint Counts:  CDAI Tender Joint count: 0 CDAI Swollen Joint count: 0  Global Assessments:  Patient Global Assessment: 2 Provider Global Assessment: 2  CDAI Calculated Score: 4    Investigation: No additional findings.Tb Gold:  CBC Latest Ref Rng & Units 01/31/2017 06/21/2016 01/19/2009  WBC 3.8 - 10.8 K/uL 8.2 8.4 9.2  Hemoglobin 13.2 - 17.1 g/dL 14.8 15.2 14.2  Hematocrit 38.5 - 50.0 % 44.3 44.4 41.6  Platelets 140 - 400 K/uL 445(H) 414(H) 304   CMP Latest Ref Rng & Units 01/31/2017 06/21/2016 01/19/2009  Glucose 65 - 99 mg/dL 148(H) 70 138(H)  BUN 7 - 25 mg/dL 12 11 21   Creatinine 0.70 - 1.33 mg/dL 0.94 0.91 1.14  Sodium 135 - 146 mmol/L 138 138 135  Potassium 3.5 - 5.3 mmol/L 4.9 4.5 3.8  Chloride 98 - 110 mmol/L 101 102 101  CO2 20 - 32 mmol/L 24 25 26   Calcium 8.6 - 10.3 mg/dL 10.0 10.1 9.4  Total Protein 6.1 - 8.1 g/dL 6.9 7.2 -  Total Bilirubin 0.2 - 1.2 mg/dL 0.5 0.6 -  Alkaline Phos 40 - 115 U/L 66 50 -  AST 10 - 35 U/L 14 24 -  ALT 9 - 46 U/L 25 53(H) -    Imaging: No results found.  Speciality Comments: No specialty comments available.    Procedures:  No procedures performed Allergies: Patient has no known allergies.   Assessment / Plan:     Visit Diagnoses: Ankylosing  spondylitis of multiple sites in spine [facility]): he has been doing much better on Enbrel. He has good flexibility of his C-spine. No synovitis was noted on examination today.  Sacroiliitis [facility]): He had no SI joint tenderness.  HLA B27 (HLA B27 positive)  High risk medication use - Enbrel 50 mg subcutaneous every week - Plan: etanercept (ENBREL SURECLICK) 50 MG/ML injection, CBC with Differential/Platelet, COMPLETE METABOLIC PANEL WITH GFR, CBC with Differential/Platelet, COMPLETE METABOLIC PANEL WITH GFR, Quantiferon tb gold assay (blood)labs and be monitored every 3 months.  Sarcoidosis: He has not flared his symptoms in a while.  DDD (degenerative disc disease), cervical: He's been having tinnitus stiffness and discomfort in his C-spine. F given him a handout on C-spine exercises.  DDD (degenerative disc disease), thoracic: Good range of motion  DDD (degenerative disc disease), lumbar: Good range of motion with no discomfort  Primary osteoarthritis of both hands: No synovitis was noted. Other medical problems are listed as  follows:  Patient gives history of colitis and was started on Lialda by his gastroenterologist recently. I've advised him  to get his notes forwarded to [place].  History of diabetes mellitus  Low testosterone  History of attention deficit disorder  History of bipolar disorder    Orders: Orders Placed This Encounter  Procedures  . CBC with Differential/Platelet  . COMPLETE METABOLIC PANEL WITH GFR  . CBC with Differential/Platelet  . COMPLETE METABOLIC PANEL WITH GFR  . Quantiferon tb gold assay (blood)   Meds ordered this encounter  Medications  . etanercept (ENBREL SURECLICK) 50 MG/ML injection    Sig: Inject 0.98 mLs (50 mg total) once a week into the skin.    Dispense:  11.76 mL    Refill:  0    Face-to-face time spent with patient was 30 minutes. 50% of time was spent in counseling and coordination of care.  Follow-Up Instructions: Return in about 5  months (around 10/07/2017) for Ankylosing spondylitis.   Bo Merino, MD  Note - This record has been created using Editor, commissioning.  Chart creation errors have been sought, but may not always  have been located. Such creation errors do not reflect on  the standard of medical care.

## 2017-05-09 ENCOUNTER — Encounter: Payer: Self-pay | Admitting: Rheumatology

## 2017-05-09 ENCOUNTER — Ambulatory Visit (INDEPENDENT_AMBULATORY_CARE_PROVIDER_SITE_OTHER): Payer: BLUE CROSS/BLUE SHIELD | Admitting: Rheumatology

## 2017-05-09 ENCOUNTER — Encounter (INDEPENDENT_AMBULATORY_CARE_PROVIDER_SITE_OTHER): Payer: Self-pay

## 2017-05-09 VITALS — BP 132/91 | HR 84 | Resp 18 | Ht 71.0 in | Wt 200.0 lb

## 2017-05-09 DIAGNOSIS — Z8639 Personal history of other endocrine, nutritional and metabolic disease: Secondary | ICD-10-CM

## 2017-05-09 DIAGNOSIS — M450 Ankylosing spondylitis of multiple sites in spine: Secondary | ICD-10-CM | POA: Diagnosis not present

## 2017-05-09 DIAGNOSIS — D869 Sarcoidosis, unspecified: Secondary | ICD-10-CM

## 2017-05-09 DIAGNOSIS — M5030 Other cervical disc degeneration, unspecified cervical region: Secondary | ICD-10-CM | POA: Diagnosis not present

## 2017-05-09 DIAGNOSIS — R7989 Other specified abnormal findings of blood chemistry: Secondary | ICD-10-CM | POA: Diagnosis not present

## 2017-05-09 DIAGNOSIS — Z1589 Genetic susceptibility to other disease: Secondary | ICD-10-CM

## 2017-05-09 DIAGNOSIS — M461 Sacroiliitis, not elsewhere classified: Secondary | ICD-10-CM

## 2017-05-09 DIAGNOSIS — Z79899 Other long term (current) drug therapy: Secondary | ICD-10-CM

## 2017-05-09 DIAGNOSIS — Z8659 Personal history of other mental and behavioral disorders: Secondary | ICD-10-CM | POA: Diagnosis not present

## 2017-05-09 DIAGNOSIS — M19041 Primary osteoarthritis, right hand: Secondary | ICD-10-CM

## 2017-05-09 DIAGNOSIS — M5136 Other intervertebral disc degeneration, lumbar region: Secondary | ICD-10-CM

## 2017-05-09 DIAGNOSIS — M19042 Primary osteoarthritis, left hand: Secondary | ICD-10-CM

## 2017-05-09 DIAGNOSIS — M5134 Other intervertebral disc degeneration, thoracic region: Secondary | ICD-10-CM | POA: Diagnosis not present

## 2017-05-09 MED ORDER — ETANERCEPT 50 MG/ML ~~LOC~~ SOAJ: 50.0000 mg | SUBCUTANEOUS | 0 refills | Status: DC

## 2017-05-09 NOTE — Patient Instructions (Addendum)
Cervical Strain and Sprain Rehab Ask your health care provider which exercises are safe for you. Do exercises exactly as told by your health care provider and adjust them as directed. It is normal to feel mild stretching, pulling, tightness, or discomfort as you do these exercises, but you should stop right away if you feel sudden pain or your pain gets worse.Do not begin these exercises until told by your health care provider. Stretching and range of motion exercises These exercises warm up your muscles and joints and improve the movement and flexibility of your neck. These exercises also help to relieve pain, numbness, and tingling. Exercise A: Cervical side bend  1. Using good posture, sit on a stable chair or stand up. 2. Without moving your shoulders, slowly tilt your left / right ear to your shoulder until you feel a stretch in your neck muscles. You should be looking straight ahead. 3. Hold for __________ seconds. 4. Repeat with the other side of your neck. Repeat __________ times. Complete this exercise __________ times a day. Exercise B: Cervical rotation  1. Using good posture, sit on a stable chair or stand up. 2. Slowly turn your head to the side as if you are looking over your left / right shoulder. ? Keep your eyes level with the ground. ? Stop when you feel a stretch along the side and the back of your neck. 3. Hold for __________ seconds. 4. Repeat this by turning to your other side. Repeat __________ times. Complete this exercise __________ times a day. Exercise C: Thoracic extension and pectoral stretch 1. Roll a towel or a small blanket so it is about 4 inches (10 cm) in diameter. 2. Lie down on your back on a firm surface. 3. Put the towel lengthwise, under your spine in the middle of your back. It should not be not under your shoulder blades. The towel should line up with your spine from your middle back to your lower back. 4. Put your hands behind your head and let your  elbows fall out to your sides. 5. Hold for __________ seconds. Repeat __________ times. Complete this exercise __________ times a day. Strengthening exercises These exercises build strength and endurance in your neck. Endurance is the ability to use your muscles for a long time, even after your muscles get tired. Exercise D: Upper cervical flexion, isometric 1. Lie on your back with a thin pillow behind your head and a small rolled-up towel under your neck. 2. Gently tuck your chin toward your chest and nod your head down to look toward your feet. Do not lift your head off the pillow. 3. Hold for __________ seconds. 4. Release the tension slowly. Relax your neck muscles completely before you repeat this exercise. Repeat __________ times. Complete this exercise __________ times a day. Exercise E: Cervical extension, isometric  1. Stand about 6 inches (15 cm) away from a wall, with your back facing the wall. 2. Place a soft object, about 6-8 inches (15-20 cm) in diameter, between the back of your head and the wall. A soft object could be a small pillow, a ball, or a folded towel. 3. Gently tilt your head back and press into the soft object. Keep your jaw and forehead relaxed. 4. Hold for __________ seconds. 5. Release the tension slowly. Relax your neck muscles completely before you repeat this exercise. Repeat __________ times. Complete this exercise __________ times a day. Posture and body mechanics  Body mechanics refers to the movements and positions of   your body while you do your daily activities. Posture is part of body mechanics. Good posture and healthy body mechanics can help to relieve stress in your body's tissues and joints. Good posture means that your spine is in its natural S-curve position (your spine is neutral), your shoulders are pulled back slightly, and your head is not tipped forward. The following are general guidelines for applying improved posture and body mechanics to  your everyday activities. Standing  When standing, keep your spine neutral and keep your feet about hip-width apart. Keep a slight bend in your knees. Your ears, shoulders, and hips should line up.  When you do a task in which you stand in one place for a long time, place one foot up on a stable object that is 2-4 inches (5-10 cm) high, such as a footstool. This helps keep your spine neutral. Sitting   When sitting, keep your spine neutral and your keep feet flat on the floor. Use a footrest, if necessary, and keep your thighs parallel to the floor. Avoid rounding your shoulders, and avoid tilting your head forward.  When working at a desk or a computer, keep your desk at a height where your hands are slightly lower than your elbows. Slide your chair under your desk so you are close enough to maintain good posture.  When working at a computer, place your monitor at a height where you are looking straight ahead and you do not have to tilt your head forward or downward to look at the screen. Resting When lying down and resting, avoid positions that are most painful for you. Try to support your neck in a neutral position. You can use a contour pillow or a small rolled-up towel. Your pillow should support your neck but not push on it. This information is not intended to replace advice given to you by your health care provider. Make sure you discuss any questions you have with your health care provider. Document Released: 06/11/2005 Document Revised: 02/16/2016 Document Reviewed: 05/18/2015 Elsevier Interactive Patient Education  2018 [employer] We placed an order today for your standing lab work.    Please come back and get your standing labs in February and every 3 months  We have open lab Monday through Friday from 8:30-11:30 AM and 1:30-4 PM at the office of Dr. Pollyann SavoyShaili Galvin Aversa.   The office is located at [address], [address], [place], [place] [address] No appointment  is necessary.   Labs are drawn by [facility].  You may receive a bill from [facility] for your lab work. If you have any questions regarding directions or hours of operation,  please call [phone].

## 2017-05-13 LAB — CBC WITH DIFFERENTIAL/PLATELET
BASOS ABS: 99 {cells}/uL (ref 0–200)
Basophils Relative: 1.3 %
EOS ABS: 395 {cells}/uL (ref 15–500)
Eosinophils Relative: 5.2 %
HCT: 45.2 % (ref 38.5–50.0)
Hemoglobin: 15.2 g/dL (ref 13.2–17.1)
Lymphs Abs: 1186 cells/uL (ref 850–3900)
MCH: 29.3 pg (ref 27.0–33.0)
MCHC: 33.6 g/dL (ref 32.0–36.0)
MCV: 87.1 fL (ref 80.0–100.0)
MPV: 9.8 fL (ref 7.5–12.5)
Monocytes Relative: 8.5 %
NEUTROS PCT: 69.4 %
Neutro Abs: 5274 cells/uL (ref 1500–7800)
Platelets: 547 10*3/uL — ABNORMAL HIGH (ref 140–400)
RBC: 5.19 10*6/uL (ref 4.20–5.80)
RDW: 12.2 % (ref 11.0–15.0)
TOTAL LYMPHOCYTE: 15.6 %
WBC: 7.6 10*3/uL (ref 3.8–10.8)
WBCMIX: 646 {cells}/uL (ref 200–950)

## 2017-05-13 LAB — COMPLETE METABOLIC PANEL WITH GFR
AG Ratio: 1.5 (calc) (ref 1.0–2.5)
ALBUMIN MSPROF: 4.8 g/dL (ref 3.6–5.1)
ALT: 46 U/L (ref 9–46)
AST: 21 U/L (ref 10–35)
Alkaline phosphatase (APISO): 72 U/L (ref 40–115)
BUN: 17 mg/dL (ref 7–25)
CO2: 30 mmol/L (ref 20–32)
CREATININE: 0.99 mg/dL (ref 0.70–1.33)
Calcium: 10.5 mg/dL — ABNORMAL HIGH (ref 8.6–10.3)
Chloride: 102 mmol/L (ref 98–110)
GFR, EST AFRICAN AMERICAN: 100 mL/min/{1.73_m2} (ref >=60)
GFR, Est Non African American: 86 mL/min/{1.73_m2} (ref >=60)
GLOBULIN: 3.1 g/dL (ref 1.9–3.7)
GLUCOSE: 116 mg/dL — AB (ref 65–99)
Potassium: 4.8 mmol/L (ref 3.5–5.3)
SODIUM: 139 mmol/L (ref 135–146)
TOTAL PROTEIN: 7.9 g/dL (ref 6.1–8.1)
Total Bilirubin: 0.5 mg/dL (ref 0.2–1.2)

## 2017-05-13 LAB — QUANTIFERON TB GOLD ASSAY (BLOOD)
QUANTIFERON NIL VALUE: 0.07 [IU]/mL
QUANTIFERON(R)-TB GOLD: NEGATIVE
Quantiferon Tb Ag Minus Nil Value: 0.01 IU/mL

## 2017-05-24 ENCOUNTER — Telehealth: Payer: Self-pay

## 2017-05-24 DIAGNOSIS — Z79899 Other long term (current) drug therapy: Secondary | ICD-10-CM

## 2017-05-24 MED ORDER — ETANERCEPT 50 MG/ML ~~LOC~~ SOAJ: 50.0000 mg | SUBCUTANEOUS | 0 refills | Status: DC

## 2017-05-24 NOTE — Telephone Encounter (Signed)
Prescription resent to the correct pharmacy.  

## 2017-05-24 NOTE — Telephone Encounter (Signed)
Patient called stating that Enbrel needs to be sent to [facility].  Rx was sent to incorrect pharmacy.  CB# is [phone].  Please advise.  Thank you.

## 2017-05-24 NOTE — Addendum Note (Signed)
Addended by: Henriette CombsHATTON, Kimberly Coye L on: 05/24/2017 01:02 PM   Modules accepted: Orders

## 2017-08-21 ENCOUNTER — Telehealth: Payer: Self-pay | Admitting: Rheumatology

## 2017-08-21 DIAGNOSIS — Z79899 Other long term (current) drug therapy: Secondary | ICD-10-CM

## 2017-08-21 MED ORDER — ETANERCEPT 50 MG/ML ~~LOC~~ SOAJ: 50.0000 mg | SUBCUTANEOUS | 0 refills | Status: DC

## 2017-08-21 NOTE — Telephone Encounter (Signed)
Last visit: 05/09/2017 Next visit: 10/15/2017 Labs: 05/09/2017 Plt count elevated, continues to trend up  Tb Gold: 05/09/2017 Negative   Advised patient he is due for labs. Patient will come in Monday to have labs drawn.   Okay to refill per Dr. Corliss Skainseveshwar.

## 2017-08-21 NOTE — Telephone Encounter (Signed)
Patient called requesting prescription refill of Enbrel [facility].  Patient states that he has no refills remaining.  Patient use [facility].

## 2017-08-26 ENCOUNTER — Other Ambulatory Visit: Payer: Self-pay

## 2017-08-26 ENCOUNTER — Telehealth: Payer: Self-pay | Admitting: Rheumatology

## 2017-08-26 DIAGNOSIS — Z79899 Other long term (current) drug therapy: Secondary | ICD-10-CM

## 2017-08-26 LAB — COMPLETE METABOLIC PANEL WITH GFR
AG RATIO: 1.7 (calc) (ref 1.0–2.5)
ALT: 21 U/L (ref 9–46)
AST: 16 U/L (ref 10–35)
Albumin: 4.3 g/dL (ref 3.6–5.1)
Alkaline phosphatase (APISO): 52 U/L (ref 40–115)
BUN: 13 mg/dL (ref 7–25)
CALCIUM: 10 mg/dL (ref 8.6–10.3)
CO2: 32 mmol/L (ref 20–32)
Chloride: 101 mmol/L (ref 98–110)
Creat: 1.08 mg/dL (ref 0.70–1.33)
GFR, EST AFRICAN AMERICAN: 90 mL/min/{1.73_m2} (ref >=60)
GFR, EST NON AFRICAN AMERICAN: 77 mL/min/{1.73_m2} (ref >=60)
GLOBULIN: 2.6 g/dL (ref 1.9–3.7)
Glucose, Bld: 276 mg/dL — ABNORMAL HIGH (ref 65–99)
POTASSIUM: 5 mmol/L (ref 3.5–5.3)
SODIUM: 139 mmol/L (ref 135–146)
TOTAL PROTEIN: 6.9 g/dL (ref 6.1–8.1)
Total Bilirubin: 0.5 mg/dL (ref 0.2–1.2)

## 2017-08-26 LAB — CBC WITH DIFFERENTIAL/PLATELET
BASOS ABS: 77 {cells}/uL (ref 0–200)
Basophils Relative: 0.9 %
EOS ABS: 391 {cells}/uL (ref 15–500)
Eosinophils Relative: 4.6 %
HCT: 43.4 % (ref 38.5–50.0)
HEMOGLOBIN: 14.7 g/dL (ref 13.2–17.1)
Lymphs Abs: 1309 cells/uL (ref 850–3900)
MCH: 29.2 pg (ref 27.0–33.0)
MCHC: 33.9 g/dL (ref 32.0–36.0)
MCV: 86.1 fL (ref 80.0–100.0)
MONOS PCT: 9.9 %
MPV: 10.6 fL (ref 7.5–12.5)
Neutro Abs: 5882 cells/uL (ref 1500–7800)
Neutrophils Relative %: 69.2 %
Platelets: 405 10*3/uL — ABNORMAL HIGH (ref 140–400)
RBC: 5.04 10*6/uL (ref 4.20–5.80)
RDW: 13 % (ref 11.0–15.0)
TOTAL LYMPHOCYTE: 15.4 %
WBC mixed population: 842 cells/uL (ref 200–950)
WBC: 8.5 10*3/uL (ref 3.8–10.8)

## 2017-08-27 NOTE — Telephone Encounter (Signed)
Opened in error

## 2017-10-02 NOTE — Progress Notes (Deleted)
Office Visit Note  Patient: Dale Curtis             Date of Birth: 03-01-1961           MRN: [id number]             PCP: Amador Cunas, [place] Referring: Amador Cunas, FNP Visit Date: 10/15/2017 Occupation: @GUAROCC @    Subjective:  No chief complaint on file.   History of Present Illness: Dale Curtis is a 57 y.o. male ***   Activities of Daily Living:  Patient reports morning stiffness for *** {minute/hour:19697}.   Patient {ACTIONS;DENIES/REPORTS:21021675::"Denies"} nocturnal pain.  Difficulty dressing/grooming: {ACTIONS;DENIES/REPORTS:21021675::"Denies"} Difficulty climbing stairs: {ACTIONS;DENIES/REPORTS:21021675::"Denies"} Difficulty getting out of chair: {ACTIONS;DENIES/REPORTS:21021675::"Denies"} Difficulty using hands for taps, buttons, cutlery, and/or writing: {ACTIONS;DENIES/REPORTS:21021675::"Denies"}   No Rheumatology ROS completed.   PMFS History:  Patient Active Problem List   Diagnosis Date Noted  . History of bipolar disorder 05/09/2017  . Sacroiliitis ([place]) 06/20/2016  . Uveitis 06/20/2016  . DJD (degenerative joint disease), cervical 06/20/2016  . 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 Primary osteoarthritis of both hands 06/20/2016  . HLA B27 (HLA B27 positive) 06/20/2016  . CHEST PAIN 01/22/2009  . SARCOIDOSIS, PULMONARY 01/30/2008  . GERD 10/18/2007  . URTICARIA 10/18/2007  . SPONDYLITIS, ANKYLOSING 10/18/2007    No past medical history on file.  Family History  Problem Relation Age of Onset  . COPD Mother   . Emphysema Mother   . Heart disease Mother   . Heart disease Father   . COPD Father   . Bipolar disorder Sister   . Ankylosing spondylitis Daughter    Past Surgical History:  Procedure Laterality Date  . BRONCHOSCOPY    . HERNIA REPAIR  2014   Social History   Social History Narrative  . Not on file     Objective: Vital  Signs: There were no vitals taken for this visit.   Physical Exam   Musculoskeletal Exam: ***  CDAI Exam: No CDAI exam completed.    Investigation: No additional findings.TB Gold: 05/09/2017 Negative  CBC Latest Ref Rng & Units 08/26/2017 05/09/2017 01/31/2017  WBC 3.8 - 10.8 Thousand/uL 8.5 7.6 8.2  Hemoglobin 13.2 - 17.1 g/dL 14.7 15.2 14.8  Hematocrit 38.5 - 50.0 % 43.4 45.2 44.3  Platelets 140 - 400 Thousand/uL 405(H) 547(H) 445(H)   CMP Latest Ref Rng & Units 08/26/2017 05/09/2017 01/31/2017  Glucose 65 - 99 mg/dL 276(H) 116(H) 148(H)  BUN 7 - 25 mg/dL 13 17 12   Creatinine 0.70 - 1.33 mg/dL 1.08 0.99 0.94  Sodium 135 - 146 mmol/L 139 139 138  Potassium 3.5 - 5.3 mmol/L 5.0 4.8 4.9  Chloride 98 - 110 mmol/L 101 102 101  CO2 20 - 32 mmol/L 32 30 24  Calcium 8.6 - 10.3 mg/dL 10.0 10.5(H) 10.0  Total Protein 6.1 - 8.1 g/dL 6.9 7.9 6.9  Total Bilirubin 0.2 - 1.2 mg/dL 0.5 0.5 0.5  Alkaline Phos 40 - 115 U/L - - 66  AST 10 - 35 U/L 16 21 14   ALT 9 - 46 U/L 21 46 25    Imaging: No results found.  Speciality Comments: No specialty comments available.    Procedures:  No procedures performed Allergies: Patient has no known allergies.   Assessment / Plan:     Visit Diagnoses: No diagnosis found.    Orders: No orders of the defined types were placed in this encounter.  No orders of the defined types were placed in this encounter.   Face-to-face time spent with patient was *** minutes. 50% of time was spent in counseling and coordination of care.  Follow-Up Instructions: No follow-ups on file.   Earnestine Mealing, CMA  Note - This record has been created using Editor, commissioning.  Chart creation errors have been sought, but may not always  have been located. Such creation errors do not reflect on  the standard of medical care.

## 2017-10-14 ENCOUNTER — Telehealth: Payer: Self-pay

## 2017-10-14 NOTE — Progress Notes (Signed)
Office Visit Note  Patient: Dale Curtis             Date of Birth: Mar 23, 1961           MRN: [id number]             PCP: Amador Cunas, [place] Referring: Amador Cunas, FNP Visit Date: 10/15/2017 Occupation: @GUAROCC @    Subjective:  Neck pain and right arm numbness.   History of Present Illness: Dale Curtis is a 57 y.o. male with history of ankylosing spondylitis and DDD.  He states that recently has been having discomfort in his C-spine and also numbness in his right arm.  He does have increased discomfort with turning his neck to the right side.  Denies any joint pain or discomfort.  No discomfort in the SI joints.  He has been taking Enbrel every other week which has been helping quite well.  He denies any thoracic and lumbar discomfort.  Activities of Daily Living:  Patient reports morning stiffness for  minute.   Patient Denies nocturnal pain.  Difficulty dressing/grooming: Denies Difficulty climbing stairs: Denies Difficulty getting out of chair: Denies Difficulty using hands for taps, buttons, cutlery, and/or writing: Denies   Review of Systems  Constitutional: Negative for fatigue and night sweats.  HENT: Negative for mouth sores, mouth dryness and nose dryness.   Eyes: Negative for redness and dryness.  Respiratory: Negative for shortness of breath and difficulty breathing.   Cardiovascular: Negative for chest pain, palpitations, hypertension, irregular heartbeat and swelling in legs/feet.  Gastrointestinal: Negative for constipation and diarrhea.  Endocrine: Negative for increased urination.  Musculoskeletal: Positive for arthralgias and joint pain. Negative for joint swelling, myalgias, muscle weakness, morning stiffness, muscle tenderness and myalgias.  Skin: Negative for color change, rash, hair loss, nodules/bumps, skin tightness, ulcers and sensitivity to sunlight.  Allergic/Immunologic: Negative for susceptible to infections.  Neurological: Negative for  dizziness, fainting, memory loss, night sweats and weakness ( ).  Hematological: Negative for swollen glands.  Psychiatric/Behavioral: Negative for depressed mood and sleep disturbance. The patient is not nervous/anxious.     PMFS History:  Patient Active Problem List   Diagnosis Date Noted  . History of bipolar disorder 05/09/2017  . Sacroiliitis ([place]) 06/20/2016  . Uveitis 06/20/2016  . DJD (degenerative joint disease), cervical 06/20/2016  . 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 Primary osteoarthritis of both hands 06/20/2016  . HLA B27 (HLA B27 positive) 06/20/2016  . CHEST PAIN 01/22/2009  . SARCOIDOSIS, PULMONARY 01/30/2008  . GERD 10/18/2007  . URTICARIA 10/18/2007  . SPONDYLITIS, ANKYLOSING 10/18/2007    History reviewed. No pertinent past medical history.  Family History  Problem Relation Age of Onset  . COPD Mother   . Emphysema Mother   . Heart disease Mother   . Heart disease Father   . COPD Father   . Bipolar disorder Sister   . Ankylosing spondylitis Daughter    Past Surgical History:  Procedure Laterality Date  . BRONCHOSCOPY    . HERNIA REPAIR  2014   Social History   Social History Narrative  . Not on file     Objective: Vital Signs: BP 136/80 (BP Location: Left Arm, Patient Position: Sitting, Cuff Size: Normal)   Pulse (!) 103   Resp 15   Ht 5' 10"  (1.778 m)   Wt 198 lb (89.8 kg)   BMI 28.41 kg/m    Physical Exam  Constitutional: He is oriented to person, place, and time. He appears well-developed and well-nourished.  HENT:  Head: Normocephalic and atraumatic.  Eyes: Pupils are equal, round, and reactive to light. Conjunctivae and EOM are normal.  Neck: Normal range of motion. Neck supple.  Cardiovascular: Normal rate, regular rhythm and normal heart sounds.  Pulmonary/Chest: Effort normal and breath sounds normal.  Abdominal: Soft. Bowel  sounds are normal.  Neurological: He is alert and oriented to person, place, and time.  Skin: Skin is warm and dry. Capillary refill takes less than 2 seconds.  Psychiatric: He has a normal mood and affect. His behavior is normal.  Nursing note and vitals reviewed.    Musculoskeletal Exam: C-spine thoracic lumbar spine good range of motion.  He discomfort with right lateral rotation of his C-spine.  He describes tingling in the area of C6-7.  Shoulder joints, elbow joints, wrist joints with good range of motion.  He has some DIP PIP thickening in his hands but no synovitis.  Hip joints, knee joints, ankles MTPs PIPs with good range of motion with no synovitis.  No SI joint tenderness was noted.  CDAI Exam: No CDAI exam completed.    Investigation: No additional findings.TB Gold: 05/09/2017 Negative  CBC Latest Ref Rng & Units 08/26/2017 05/09/2017 01/31/2017  WBC 3.8 - 10.8 Thousand/uL 8.5 7.6 8.2  Hemoglobin 13.2 - 17.1 g/dL 14.7 15.2 14.8  Hematocrit 38.5 - 50.0 % 43.4 45.2 44.3  Platelets 140 - 400 Thousand/uL 405(H) 547(H) 445(H)   CMP Latest Ref Rng & Units 08/26/2017 05/09/2017 01/31/2017  Glucose 65 - 99 mg/dL 276(H) 116(H) 148(H)  BUN 7 - 25 mg/dL 13 17 12   Creatinine 0.70 - 1.33 mg/dL 1.08 0.99 0.94  Sodium 135 - 146 mmol/L 139 139 138  Potassium 3.5 - 5.3 mmol/L 5.0 4.8 4.9  Chloride 98 - 110 mmol/L 101 102 101  CO2 20 - 32 mmol/L 32 30 24  Calcium 8.6 - 10.3 mg/dL 10.0 10.5(H) 10.0  Total Protein 6.1 - 8.1 g/dL 6.9 7.9 6.9  Total Bilirubin 0.2 - 1.2 mg/dL 0.5 0.5 0.5  Alkaline Phos 40 - 115 U/L - - 66  AST 10 - 35 U/L 16 21 14   ALT 9 - 46 U/L 21 46 25    Imaging: No results found.  Speciality Comments: No specialty comments available.    Procedures:  No procedures performed Allergies: Patient has no known allergies.   Assessment / Plan:     Visit Diagnoses: Ankylosing spondylitis of multiple sites in spine ([facility])-he is doing quite well on Enbrel.  He denies any  joint stiffness or joint swelling.  He has been taking Enbrel every other week.  Sacroiliitis ([place]) -he had no SI joint tenderness today.  HLA B27 (HLA B27 positive)  High risk medication use - Enbrel 50 mg subcutaneous every week.  His labs are stable.  He has high glucose.  He is diabetic.  Sarcoidosis-in remission  Neck pain-he has been having increased neck pain and right arm paresthesias.  He has history of disc disease of cervical spine.  I have given him a prescription refill for Flexeril which may help.  As he is diabetic we cannot give a steroid taper.  He plans to take some over-the-counter anti-inflammatories for a few days.  I also offered physical therapy which she declined.  I have given him a handout on neck exercises.  DDD (degenerative disc disease), cervical-increased pain.  DDD (degenerative disc disease), lumbar-he does not have  much discomfort.  DDD (degenerative disc disease), thoracic-good range of motion  Primary osteoarthritis of both hands-is doing well currently.  History of bipolar disorder  History of diabetes mellitus-his blood sugar has been elevated.  He states his hemoglobin A1c is elevated.  Dietary modifications were discussed.  History of attention deficit disorder  Low testosterone    Orders: No orders of the defined types were placed in this encounter.  Meds ordered this encounter  Medications  . cyclobenzaprine (FLEXERIL) 10 MG tablet    Sig: 1 tablet p.o. nightly as needed    Dispense:  30 tablet    Refill:  0  . etanercept (ENBREL SURECLICK) 50 MG/ML injection    Sig: Inject 0.98 mLs (50 mg total) into the skin once a week.    Dispense:  11.76 mL    Refill:  0    Follow-Up Instructions: Return in about 5 months (around 03/17/2018) for Ankylosing spondylitis, Osteoarthritis.   Bo Merino, MD  Note - This record has been created using Editor, commissioning.  Chart creation errors have been sought, but may not always  have been  located. Such creation errors do not reflect on  the standard of medical care.

## 2017-10-14 NOTE — Telephone Encounter (Signed)
Received a prior authorization request for Enbrel. Authorization has been submitted to pts insruance via cover my  Meds. Authorization has been approved from 09/14/2017 through 10/14/2018.  Case ID: [id number]  Will send document to scan center once received.   Called pt to update. Left message.   Maeson Purohit, [place], CPhT 12:00 PM

## 2017-10-15 ENCOUNTER — Ambulatory Visit: Payer: BLUE CROSS/BLUE SHIELD | Admitting: Rheumatology

## 2017-10-15 ENCOUNTER — Ambulatory Visit (INDEPENDENT_AMBULATORY_CARE_PROVIDER_SITE_OTHER): Payer: BLUE CROSS/BLUE SHIELD | Admitting: Rheumatology

## 2017-10-15 ENCOUNTER — Encounter: Payer: Self-pay | Admitting: Rheumatology

## 2017-10-15 VITALS — BP 136/80 | HR 103 | Resp 15 | Ht 70.0 in | Wt 198.0 lb

## 2017-10-15 DIAGNOSIS — M19041 Primary osteoarthritis, right hand: Secondary | ICD-10-CM | POA: Diagnosis not present

## 2017-10-15 DIAGNOSIS — Z79899 Other long term (current) drug therapy: Secondary | ICD-10-CM

## 2017-10-15 DIAGNOSIS — R7989 Other specified abnormal findings of blood chemistry: Secondary | ICD-10-CM

## 2017-10-15 DIAGNOSIS — M5030 Other cervical disc degeneration, unspecified cervical region: Secondary | ICD-10-CM

## 2017-10-15 DIAGNOSIS — M461 Sacroiliitis, not elsewhere classified: Secondary | ICD-10-CM | POA: Diagnosis not present

## 2017-10-15 DIAGNOSIS — Z8659 Personal history of other mental and behavioral disorders: Secondary | ICD-10-CM

## 2017-10-15 DIAGNOSIS — Z8639 Personal history of other endocrine, nutritional and metabolic disease: Secondary | ICD-10-CM

## 2017-10-15 DIAGNOSIS — M450 Ankylosing spondylitis of multiple sites in spine: Secondary | ICD-10-CM | POA: Diagnosis not present

## 2017-10-15 DIAGNOSIS — M51369 Other intervertebral disc degeneration, lumbar region without mention of lumbar back pain or lower extremity pain: Secondary | ICD-10-CM

## 2017-10-15 DIAGNOSIS — M542 Cervicalgia: Secondary | ICD-10-CM | POA: Diagnosis not present

## 2017-10-15 DIAGNOSIS — M5134 Other intervertebral disc degeneration, thoracic region: Secondary | ICD-10-CM | POA: Diagnosis not present

## 2017-10-15 DIAGNOSIS — D869 Sarcoidosis, unspecified: Secondary | ICD-10-CM

## 2017-10-15 DIAGNOSIS — M19042 Primary osteoarthritis, left hand: Secondary | ICD-10-CM

## 2017-10-15 DIAGNOSIS — Z1589 Genetic susceptibility to other disease: Secondary | ICD-10-CM | POA: Diagnosis not present

## 2017-10-15 DIAGNOSIS — M5136 Other intervertebral disc degeneration, lumbar region: Secondary | ICD-10-CM | POA: Diagnosis not present

## 2017-10-15 MED ORDER — CYCLOBENZAPRINE HCL 10 MG PO TABS: ORAL_TABLET | ORAL | 0 refills | Status: DC

## 2017-10-15 MED ORDER — ETANERCEPT 50 MG/ML ~~LOC~~ SOAJ: 50.0000 mg | SUBCUTANEOUS | 0 refills | Status: DC

## 2017-10-15 NOTE — Patient Instructions (Addendum)
Standing Labs We placed an order today for your standing lab work.    Please come back and get your standing labs in June Cervical Strain and Sprain Rehab Ask your health care provider which exercises are safe for you. Do exercises exactly as told by your health care provider and adjust them as directed. It is normal to feel mild stretching, pulling, tightness, or discomfort as you do these exercises, but you should stop right away if you feel sudden pain or your pain gets worse.Do not begin these exercises until told by your health care provider. Stretching and range of motion exercises These exercises warm up your muscles and joints and improve the movement and flexibility of your neck. These exercises also help to relieve pain, numbness, and tingling. Exercise A: Cervical side bend  1. Using good posture, sit on a stable chair or stand up. 2. Without moving your shoulders, slowly tilt your left / right ear to your shoulder until you feel a stretch in your neck muscles. You should be looking straight ahead. 3. Hold for __________ seconds. 4. Repeat with the other side of your neck. Repeat __________ times. Complete this exercise __________ times a day. Exercise B: Cervical rotation  1. Using good posture, sit on a stable chair or stand up. 2. Slowly turn your head to the side as if you are looking over your left / right shoulder. ? Keep your eyes level with the ground. ? Stop when you feel a stretch along the side and the back of your neck. 3. Hold for __________ seconds. 4. Repeat this by turning to your other side. Repeat __________ times. Complete this exercise __________ times a day. Exercise C: Thoracic extension and pectoral stretch 1. Roll a towel or a small blanket so it is about 4 inches (10 cm) in diameter. 2. Lie down on your back on a firm surface. 3. Put the towel lengthwise, under your spine in the middle of your back. It should not be not under your shoulder blades. The  towel should line up with your spine from your middle back to your lower back. 4. Put your hands behind your head and let your elbows fall out to your sides. 5. Hold for __________ seconds. Repeat __________ times. Complete this exercise __________ times a day. Strengthening exercises These exercises build strength and endurance in your neck. Endurance is the ability to use your muscles for a long time, even after your muscles get tired. Exercise D: Upper cervical flexion, isometric 1. Lie on your back with a thin pillow behind your head and a small rolled-up towel under your neck. 2. Gently tuck your chin toward your chest and nod your head down to look toward your feet. Do not lift your head off the pillow. 3. Hold for __________ seconds. 4. Release the tension slowly. Relax your neck muscles completely before you repeat this exercise. Repeat __________ times. Complete this exercise __________ times a day. Exercise E: Cervical extension, isometric  1. Stand about 6 inches (15 cm) away from a wall, with your back facing the wall. 2. Place a soft object, about 6-8 inches (15-20 cm) in diameter, between the back of your head and the wall. A soft object could be a small pillow, a ball, or a folded towel. 3. Gently tilt your head back and press into the soft object. Keep your jaw and forehead relaxed. 4. Hold for __________ seconds. 5. Release the tension slowly. Relax your neck muscles completely before you repeat this  exercise. Repeat __________ times. Complete this exercise __________ times a day. Posture and body mechanics  Body mechanics refers to the movements and positions of your body while you do your daily activities. Posture is part of body mechanics. Good posture and healthy body mechanics can help to relieve stress in your body's tissues and joints. Good posture means that your spine is in its natural S-curve position (your spine is neutral), your shoulders are pulled back slightly,  and your head is not tipped forward. The following are general guidelines for applying improved posture and body mechanics to your everyday activities. Standing  When standing, keep your spine neutral and keep your feet about hip-width apart. Keep a slight bend in your knees. Your ears, shoulders, and hips should line up.  When you do a task in which you stand in one place for a long time, place one foot up on a stable object that is 2-4 inches (5-10 cm) high, such as a footstool. This helps keep your spine neutral. Sitting   When sitting, keep your spine neutral and your keep feet flat on the floor. Use a footrest, if necessary, and keep your thighs parallel to the floor. Avoid rounding your shoulders, and avoid tilting your head forward.  When working at a desk or a computer, keep your desk at a height where your hands are slightly lower than your elbows. Slide your chair under your desk so you are close enough to maintain good posture.  When working at a computer, place your monitor at a height where you are looking straight ahead and you do not have to tilt your head forward or downward to look at the screen. Resting When lying down and resting, avoid positions that are most painful for you. Try to support your neck in a neutral position. You can use a contour pillow or a small rolled-up towel. Your pillow should support your neck but not push on it. This information is not intended to replace advice given to you by your health care provider. Make sure you discuss any questions you have with your health care provider. Document Released: 06/11/2005 Document Revised: 02/16/2016 Document Reviewed: 05/18/2015 Elsevier Interactive Patient Education  [employer]2018 [employer]  and every 3 months  We have open lab Monday through Friday from 8:30-11:30 AM and 1:30-4:00 PM  at the office of Dr. Pollyann SavoyShaili Adren Dollins.   You may experience shorter wait times on Monday and Friday afternoons. The office is  located at [address], [address], [place], [place] [address] No appointment is necessary.   Labs are drawn by [facility].  You may receive a bill from [facility] for your lab work. If you have any questions regarding directions or hours of operation,  please call [phone].

## 2018-02-13 NOTE — Progress Notes (Signed)
Office Visit Note  Patient: Dale Curtis             Date of Birth: February 09, 1961           MRN: [id number]             PCP: Amador Cunas, FNP Referring: Amador Cunas, FNP Visit Date: 02/27/2018 Occupation: @GUAROCC @  Subjective:  Pain in multiple joints   History of Present Illness: Dale Curtis is a 57 y.o. male history of ankylosing spondylitis, sarcoidosis and degenerative disc disease.  He states he has been having discomfort in his cervical spine especially with the right lateral rotation.  He states the pain radiates down into his shoulder blade and also he feels numbness in his right arm.  He has been experiencing numbness in his bilateral hands when he is driving.  He also complains of some lower back pain.  He has been going to a chiropractor and also getting some dry needling which has not helped much.  He also has discomfort in his left knee joint especially with climbing stairs.  There is no history of joint swelling.  Activities of Daily Living:  Patient reports morning stiffness for 1-2 hours.   Patient Denies nocturnal pain.  Difficulty dressing/grooming: Denies Difficulty climbing stairs: Denies Difficulty getting out of chair: Denies Difficulty using hands for taps, buttons, cutlery, and/or writing: Denies  Review of Systems  Constitutional: Negative for fatigue and night sweats.  HENT: Negative for mouth sores, ear ringing, mouth dryness and nose dryness.   Eyes: Negative for redness and dryness.  Respiratory: Negative for shortness of breath, apnea and difficulty breathing.   Cardiovascular: Negative for chest pain, palpitations, hypertension, irregular heartbeat and swelling in legs/feet.  Gastrointestinal: Negative for blood in stool, constipation and diarrhea.  Endocrine: Negative for increased urination.  Genitourinary: Negative for difficulty urinating and urgency.  Musculoskeletal: Positive for arthralgias, joint pain, myalgias, muscle weakness, morning  stiffness and myalgias. Negative for joint swelling and muscle tenderness.  Skin: Negative for color change, rash, hair loss, nodules/bumps, skin tightness, ulcers and sensitivity to sunlight.  Allergic/Immunologic: Negative for susceptible to infections.  Neurological: Positive for weakness. Negative for dizziness, fainting, headaches, memory loss and night sweats.  Hematological: Negative for swollen glands.  Psychiatric/Behavioral: Positive for sleep disturbance. Negative for depressed mood. The patient is not nervous/anxious.     PMFS History:  Patient Active Problem List   Diagnosis Date Noted  . History of bipolar disorder 05/09/2017  . Sacroiliitis ([place]) 06/20/2016  . Uveitis 06/20/2016  . DJD (degenerative joint disease), cervical 06/20/2016  . 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 Primary osteoarthritis of both hands 06/20/2016  . HLA B27 (HLA B27 positive) 06/20/2016  . CHEST PAIN 01/22/2009  . SARCOIDOSIS, PULMONARY 01/30/2008  . GERD 10/18/2007  . URTICARIA 10/18/2007  . SPONDYLITIS, ANKYLOSING 10/18/2007    History reviewed. No pertinent past medical history.  Family History  Problem Relation Age of Onset  . COPD Mother   . Emphysema Mother   . Heart disease Mother   . Heart disease Father   . COPD Father   . Bipolar disorder Sister   . Ankylosing spondylitis Daughter    Past Surgical History:  Procedure Laterality Date  . BRONCHOSCOPY    . HERNIA REPAIR  2014   Social History   Social History Narrative  . Not on file    Objective: Vital Signs: BP 122/83 (  BP Location: Left Arm, Patient Position: Sitting, Cuff Size: Normal)   Pulse 88   Ht 5' 9"  (1.753 m)   Wt 183 lb 12.8 oz (83.4 kg)   BMI 27.14 kg/m    Physical Exam  Constitutional: He is oriented to person, place, and time. He appears well-developed and well-nourished.  HENT:  Head: Normocephalic  and atraumatic.  Eyes: Pupils are equal, round, and reactive to light. Conjunctivae and EOM are normal.  Neck: Normal range of motion. Neck supple.  Cardiovascular: Normal rate, regular rhythm and normal heart sounds.  Pulmonary/Chest: Effort normal and breath sounds normal.  Abdominal: Soft. Bowel sounds are normal.  Neurological: He is alert and oriented to person, place, and time.  Skin: Skin is warm and dry. Capillary refill takes less than 2 seconds.  Psychiatric: He has a normal mood and affect. His behavior is normal.  Nursing note and vitals reviewed.    Musculoskeletal Exam: C-spine good range of motion.  He had discomfort with right lateral rotation and also forward flexion.  He describes pain radiating to his right shoulder.  Thoracic and lumbar spine limited range of motion.  He did have discomfort over bilateral SI joints.  Shoulder joints elbow joints wrist joint MCPs PIPs DIPs were in good range of motion.  He has some DIP PIP thickening in his hands.  Hip joints knee joints ankles MTPs PIPs were in good range of motion.  He has painful range of motion of his left knee joint.  CDAI Exam: CDAI Score: Not documented Patient Global Assessment: Not documented; Provider Global Assessment: Not documented Swollen: Not documented; Tender: Not documented Joint Exam   Not documented   There is currently no information documented on the homunculus. Go to the Rheumatology activity and complete the homunculus joint exam.  Investigation: No additional findings.  Imaging: Xr Cervical Spine 2 Or 3 Views  Result Date: 02/27/2018 Loss of lordosis was noted.  C5-C6, C6-C7 narrowing was noted.  Mild facet joint arthropathy was noted.  Anterior spurring was noted.  Xr Knee 3 View Left  Result Date: 02/27/2018 No significant knee joint narrowing was noted.  No chondrocalcinosis was noted.  Mild patellofemoral narrowing was noted. 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  No significant disc space narrowing was noted.  Facet joint arthropathy was noted.  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 Impression: These findings are consistent with ankylosing spondylitis, and facet joint arthropathy.  Xr Pelvis 1-2 Views  Result Date: 02/27/2018 SI joints are completely fused.   Recent Labs: Lab Results  Component Value Date   WBC 8.5 08/26/2017   HGB 14.7 08/26/2017   PLT 405 (H) 08/26/2017   NA 139 08/26/2017   K 5.0 08/26/2017   CL 101 08/26/2017   CO2 32 08/26/2017   GLUCOSE 276 (H) 08/26/2017   BUN 13 08/26/2017   CREATININE 1.08 08/26/2017   BILITOT 0.5 08/26/2017   ALKPHOS 66 01/31/2017   AST 16 08/26/2017   ALT 21 08/26/2017   PROT 6.9 08/26/2017   ALBUMIN 4.2 01/31/2017   CALCIUM 10.0 08/26/2017   GFRAA 90 08/26/2017   QFTBGOLD NEGATIVE 05/09/2017    Speciality Comments: No specialty comments available.  Procedures:  No procedures performed Allergies: Patient has no known allergies.   Assessment / Plan:     Visit Diagnoses: Ankylosing spondylitis of multiple sites in spine (HCC)-his ankylosing spondylitis has been quite well controlled  so far.  He has been taking Enbrel which has been controlling his symptoms.  Sacroiliitis (HCC)-he has been experiencing increased SI joint pain lately.  HLA B27 (HLA B27 positive)  High risk medication use - Enbrel 50 mg subcutaneous every week.  His labs have been stable.  His labs are past due.  I will obtain his labs today.  Pain, neck -been having discomfort in cervical spine especially with right lateral rotation.  Plan: XR Cervical Spine 2 or 3 views.  The x-rays show loss of lordosis and mild disc disease.  A handout on cervical spine exercise was given.  Chronic midline low back pain without sciatica -he has been having increased lower back pain . Plan: XR Lumbar Spine 2-3 Views.  Findings were  consistent with facet joint arthropathy.  Have given him a handout on back exercises.  Chronic SI joint pain -he has been having increased SI joint pain.  Plan: XR Pelvis 1-2 Views.  The SI joints are completely fused.  Acute pain of left knee -he complains of knee joint discomfort especially with climbing stairs.  There was no synovitis on examination.  Plan: XR KNEE 3 VIEW LEFT.  The x-rays were consistent with mild chondromalacia patella.  Handout on knee exercises was given.  Sarcoidosis-has been in remission.  DDD (degenerative disc disease), cervical-increased pain and radiculopathy  DDD (degenerative disc disease), lumbar-increased pain  DDD (degenerative disc disease), thoracic  Primary osteoarthritis of both hands  History of diabetes mellitus  History of bipolar disorder   Orders: Orders Placed This Encounter  Procedures  . XR Cervical Spine 2 or 3 views  . XR Lumbar Spine 2-3 Views  . XR Pelvis 1-2 Views  . XR KNEE 3 VIEW LEFT  . CBC with Differential/Platelet  . COMPLETE METABOLIC PANEL WITH GFR  . QuantiFERON-TB Gold Plus   Meds ordered this encounter  Medications  . DISCONTD: etanercept (ENBREL SURECLICK) 50 MG/ML injection    Sig: Inject 0.98 mLs (50 mg total) into the skin once a week.    Dispense:  11.76 mL    Refill:  0  . cyclobenzaprine (FLEXERIL) 10 MG tablet    Sig: 1 tablet p.o. nightly as needed    Dispense:  30 tablet    Refill:  0  . etanercept (ENBREL SURECLICK) 50 MG/ML injection    Sig: Inject 0.98 mLs (50 mg total) into the skin once a week.    Dispense:  11.76 mL    Refill:  0    Face-to-face time spent with patient was 30 minutes. Greater than 50% of time was spent in counseling and coordination of care.  Follow-Up Instructions: Return in about 4 months (around 06/29/2018) for Ankylosing spondylitis, osteoarthritis, DDD.   Bo Merino, MD  Note - This record has been created using Editor, commissioning.  Chart creation errors have  been sought, but may not always  have been located. Such creation errors do not reflect on  the standard of medical care.

## 2018-02-27 ENCOUNTER — Ambulatory Visit (INDEPENDENT_AMBULATORY_CARE_PROVIDER_SITE_OTHER): Payer: Self-pay

## 2018-02-27 ENCOUNTER — Encounter: Payer: Self-pay | Admitting: Rheumatology

## 2018-02-27 ENCOUNTER — Ambulatory Visit (INDEPENDENT_AMBULATORY_CARE_PROVIDER_SITE_OTHER): Payer: BLUE CROSS/BLUE SHIELD | Admitting: Rheumatology

## 2018-02-27 VITALS — BP 122/83 | HR 88 | Ht 69.0 in | Wt 183.8 lb

## 2018-02-27 DIAGNOSIS — M25562 Pain in left knee: Secondary | ICD-10-CM

## 2018-02-27 DIAGNOSIS — M450 Ankylosing spondylitis of multiple sites in spine: Secondary | ICD-10-CM

## 2018-02-27 DIAGNOSIS — M19041 Primary osteoarthritis, right hand: Secondary | ICD-10-CM

## 2018-02-27 DIAGNOSIS — M545 Low back pain: Secondary | ICD-10-CM | POA: Diagnosis not present

## 2018-02-27 DIAGNOSIS — M461 Sacroiliitis, not elsewhere classified: Secondary | ICD-10-CM | POA: Diagnosis not present

## 2018-02-27 DIAGNOSIS — Z8639 Personal history of other endocrine, nutritional and metabolic disease: Secondary | ICD-10-CM

## 2018-02-27 DIAGNOSIS — M542 Cervicalgia: Secondary | ICD-10-CM

## 2018-02-27 DIAGNOSIS — Z79899 Other long term (current) drug therapy: Secondary | ICD-10-CM | POA: Diagnosis not present

## 2018-02-27 DIAGNOSIS — M5030 Other cervical disc degeneration, unspecified cervical region: Secondary | ICD-10-CM

## 2018-02-27 DIAGNOSIS — M533 Sacrococcygeal disorders, not elsewhere classified: Secondary | ICD-10-CM

## 2018-02-27 DIAGNOSIS — M5134 Other intervertebral disc degeneration, thoracic region: Secondary | ICD-10-CM

## 2018-02-27 DIAGNOSIS — D869 Sarcoidosis, unspecified: Secondary | ICD-10-CM

## 2018-02-27 DIAGNOSIS — M19042 Primary osteoarthritis, left hand: Secondary | ICD-10-CM

## 2018-02-27 DIAGNOSIS — M5136 Other intervertebral disc degeneration, lumbar region: Secondary | ICD-10-CM

## 2018-02-27 DIAGNOSIS — Z1589 Genetic susceptibility to other disease: Secondary | ICD-10-CM | POA: Diagnosis not present

## 2018-02-27 DIAGNOSIS — G8929 Other chronic pain: Secondary | ICD-10-CM

## 2018-02-27 MED ORDER — ETANERCEPT 50 MG/ML ~~LOC~~ SOAJ: 50.0000 mg | SUBCUTANEOUS | 0 refills | Status: DC

## 2018-02-27 MED ORDER — CYCLOBENZAPRINE HCL 10 MG PO TABS: ORAL_TABLET | ORAL | 0 refills | Status: DC

## 2018-02-27 NOTE — Patient Instructions (Addendum)
Standing Labs We placed an order today for your standing lab work.    Please come back and get your standing labs in December every 3 months  We have open lab Monday through Friday from 8:30-11:30 AM and 1:30-4:00 PM  at the office of Dr. Pollyann Savoy.   You may experience shorter wait times on Monday and Friday afternoons. The office is located at [address], [place], [place] [address] No appointment is necessary.   Labs are drawn by [facility].  You may receive a bill from [facility] for your lab work. If you have any questions regarding directions or hours of operation,  please call [phone].    Knee Exercises Ask your health care provider which exercises are safe for you. Do exercises exactly as told by your health care provider and adjust them as directed. It is normal to feel mild stretching, pulling, tightness, or discomfort as you do these exercises, but you should stop right away if you feel sudden pain or your pain gets worse.Do not begin these exercises until told by your health care provider. STRETCHING AND RANGE OF MOTION EXERCISES These exercises warm up your muscles and joints and improve the movement and flexibility of your knee. These exercises also help to relieve pain, numbness, and tingling. Exercise A: Knee Extension, Prone 1. Lie on your abdomen on a bed. 2. Place your left / right knee just beyond the edge of the surface so your knee is not on the bed. You can put a towel under your left / right thigh just above your knee for comfort. 3. Relax your leg muscles and allow gravity to straighten your knee. You should feel a stretch behind your left / right knee. 4. Hold this position for __________ seconds. 5. Scoot up so your knee is supported between repetitions. Repeat __________ times. Complete this stretch __________ times a day. Exercise B: Knee Flexion, Active  1. Lie on your back with both knees straight. If this causes back discomfort, bend your  left / right knee so your foot is flat on the floor. 2. Slowly slide your left / right heel back toward your buttocks until you feel a gentle stretch in the front of your knee or thigh. 3. Hold this position for __________ seconds. 4. Slowly slide your left / right heel back to the starting position. Repeat __________ times. Complete this exercise __________ times a day. Exercise C: Quadriceps, Prone  1. Lie on your abdomen on a firm surface, such as a bed or padded floor. 2. Bend your left / right knee and hold your ankle. If you cannot reach your ankle or pant leg, loop a belt around your foot and grab the belt instead. 3. Gently pull your heel toward your buttocks. Your knee should not slide out to the side. You should feel a stretch in the front of your thigh and knee. 4. Hold this position for __________ seconds. Repeat __________ times. Complete this stretch __________ times a day. Exercise D: Hamstring, Supine 1. Lie on your back. 2. Loop a belt or towel over the ball of your left / right foot. The ball of your foot is on the walking surface, right under your toes. 3. Straighten your left / right knee and slowly pull on the belt to raise your leg until you feel a gentle stretch behind your knee. ? Do not let your left / right knee bend while you do this. ? Keep your other leg flat on the floor.  4. Hold this position for __________ seconds. Repeat __________ times. Complete this stretch __________ times a day. STRENGTHENING EXERCISES These exercises build strength and endurance in your knee. Endurance is the ability to use your muscles for a long time, even after they get tired. Exercise E: Quadriceps, Isometric  1. Lie on your back with your left / right leg extended and your other knee bent. Put a rolled towel or small pillow under your knee if told by your health care provider. 2. Slowly tense the muscles in the front of your left / right thigh. You should see your kneecap slide up  toward your hip or see increased dimpling just above the knee. This motion will push the back of the knee toward the floor. 3. For __________ seconds, keep the muscle as tight as you can without increasing your pain. 4. Relax the muscles slowly and completely. Repeat __________ times. Complete this exercise __________ times a day. Exercise F: Straight Leg Raises - Quadriceps 1. Lie on your back with your left / right leg extended and your other knee bent. 2. Tense the muscles in the front of your left / right thigh. You should see your kneecap slide up or see increased dimpling just above the knee. Your thigh may even shake a bit. 3. Keep these muscles tight as you raise your leg 4-6 inches (10-15 cm) off the floor. Do not let your knee bend. 4. Hold this position for __________ seconds. 5. Keep these muscles tense as you lower your leg. 6. Relax your muscles slowly and completely after each repetition. Repeat __________ times. Complete this exercise __________ times a day. Exercise G: Hamstring, Isometric 1. Lie on your back on a firm surface. 2. Bend your left / right knee approximately __________ degrees. 3. Dig your left / right heel into the surface as if you are trying to pull it toward your buttocks. Tighten the muscles in the back of your thighs to dig as hard as you can without increasing any pain. 4. Hold this position for __________ seconds. 5. Release the tension gradually and allow your muscles to relax completely for __________ seconds after each repetition. Repeat __________ times. Complete this exercise __________ times a day. Exercise H: Hamstring Curls  If told by your health care provider, do this exercise while wearing ankle weights. Begin with __________ weights. Then increase the weight by 1 lb (0.5 kg) increments. Do not wear ankle weights that are more than __________. 1. Lie on your abdomen with your legs straight. 2. Bend your left / right knee as far as you can  without feeling pain. Keep your hips flat against the floor. 3. Hold this position for __________ seconds. 4. Slowly lower your leg to the starting position.  Repeat __________ times. Complete this exercise __________ times a day. Exercise I: Squats (Quadriceps) 1. Stand in front of a table, with your feet and knees pointing straight ahead. You may rest your hands on the table for balance but not for support. 2. Slowly bend your knees and lower your hips like you are going to sit in a chair. ? Keep your weight over your heels, not over your toes. ? Keep your lower legs upright so they are parallel with the table legs. ? Do not let your hips go lower than your knees. ? Do not bend lower than told by your health care provider. ? If your knee pain increases, do not bend as low. 3. Hold the squat position for __________ seconds.  4. Slowly push with your legs to return to standing. Do not use your hands to pull yourself to standing. Repeat __________ times. Complete this exercise __________ times a day. Exercise J: Wall Slides (Quadriceps)  1. Lean your back against a smooth wall or door while you walk your feet out 18-24 inches (46-61 cm) from it. 2. Place your feet hip-width apart. 3. Slowly slide down the wall or door until your knees bend __________ degrees. Keep your knees over your heels, not over your toes. Keep your knees in line with your hips. 4. Hold for __________ seconds. Repeat __________ times. Complete this exercise __________ times a day. Exercise K: Straight Leg Raises - Hip Abductors 1. Lie on your side with your left / right leg in the top position. Lie so your head, shoulder, knee, and hip line up. You may bend your bottom knee to help you keep your balance. 2. Roll your hips slightly forward so your hips are stacked directly over each other and your left / right knee is facing forward. 3. Leading with your heel, lift your top leg 4-6 inches (10-15 cm). You should feel the  muscles in your outer hip lifting. ? Do not let your foot drift forward. ? Do not let your knee roll toward the ceiling. 4. Hold this position for __________ seconds. 5. Slowly return your leg to the starting position. 6. Let your muscles relax completely after each repetition. Repeat __________ times. Complete this exercise __________ times a day. Exercise L: Straight Leg Raises - Hip Extensors 1. Lie on your abdomen on a firm surface. You can put a pillow under your hips if that is more comfortable. 2. Tense the muscles in your buttocks and lift your left / right leg about 4-6 inches (10-15 cm). Keep your knee straight as you lift your leg. 3. Hold this position for __________ seconds. 4. Slowly lower your leg to the starting position. 5. Let your leg relax completely after each repetition. Repeat __________ times. Complete this exercise __________ times a day. This information is not intended to replace advice given to you by your health care provider. Make sure you discuss any questions you have with your health care provider. Document Released: 04/25/2005 Document Revised: 03/05/2016 Document Reviewed: 04/17/2015 Elsevier Interactive Patient Education  2018 [employer]  Back Exercises The following exercises strengthen the muscles that help to support the back. They also help to keep the lower back flexible. Doing these exercises can help to prevent back pain or lessen existing pain. If you have back pain or discomfort, try doing these exercises 2-3 times each day or as told by your health care provider. When the pain goes away, do them once each day, but increase the number of times that you repeat the steps for each exercise (do more repetitions). If you do not have back pain or discomfort, do these exercises once each day or as told by your health care provider. Exercises Single Knee to Chest  Repeat these steps 3-5 times for each leg: 1. Lie on your back on a firm bed or the  floor with your legs extended. 2. Bring one knee to your chest. Your other leg should stay extended and in contact with the floor. 3. Hold your knee in place by grabbing your knee or thigh. 4. Pull on your knee until you feel a gentle stretch in your lower back. 5. Hold the stretch for 10-30 seconds. 6. Slowly release and straighten your leg.  Pelvic Tilt  Repeat these steps 5-10 times: 1. Lie on your back on a firm bed or the floor with your legs extended. 2. Bend your knees so they are pointing toward the ceiling and your feet are flat on the floor. 3. Tighten your lower abdominal muscles to press your lower back against the floor. This motion will tilt your pelvis so your tailbone points up toward the ceiling instead of pointing to your feet or the floor. 4. With gentle tension and even breathing, hold this position for 5-10 seconds.  Cat-Cow  Repeat these steps until your lower back becomes more flexible: 1. Get into a hands-and-knees position on a firm surface. Keep your hands under your shoulders, and keep your knees under your hips. You may place padding under your knees for comfort. 2. Let your head hang down, and point your tailbone toward the floor so your lower back becomes rounded like the back of a cat. 3. Hold this position for 5 seconds. 4. Slowly lift your head and point your tailbone up toward the ceiling so your back forms a sagging arch like the back of a cow. 5. Hold this position for 5 seconds.  Press-Ups  Repeat these steps 5-10 times: 1. Lie on your abdomen (face-down) on the floor. 2. Place your palms near your head, about shoulder-width apart. 3. While you keep your back as relaxed as possible and keep your hips on the floor, slowly straighten your arms to raise the top half of your body and lift your shoulders. Do not use your back muscles to raise your upper torso. You may adjust the placement of your hands to make yourself more comfortable. 4. Hold this  position for 5 seconds while you keep your back relaxed. 5. Slowly return to lying flat on the floor.  Bridges  Repeat these steps 10 times: 1. Lie on your back on a firm surface. 2. Bend your knees so they are pointing toward the ceiling and your feet are flat on the floor. 3. Tighten your buttocks muscles and lift your buttocks off of the floor until your waist is at almost the same height as your knees. You should feel the muscles working in your buttocks and the back of your thighs. If you do not feel these muscles, slide your feet 1-2 inches farther away from your buttocks. 4. Hold this position for 3-5 seconds. 5. Slowly lower your hips to the starting position, and allow your buttocks muscles to relax completely.  If this exercise is too easy, try doing it with your arms crossed over your chest. Abdominal Crunches  Repeat these steps 5-10 times: 1. Lie on your back on a firm bed or the floor with your legs extended. 2. Bend your knees so they are pointing toward the ceiling and your feet are flat on the floor. 3. Cross your arms over your chest. 4. Tip your chin slightly toward your chest without bending your neck. 5. Tighten your abdominal muscles and slowly raise your trunk (torso) high enough to lift your shoulder blades a tiny bit off of the floor. Avoid raising your torso higher than that, because it can put too much stress on your low back and it does not help to strengthen your abdominal muscles. 6. Slowly return to your starting position.  Back Lifts Repeat these steps 5-10 times: 1. Lie on your abdomen (face-down) with your arms at your sides, and rest your forehead on the floor. 2. Tighten the muscles in your legs and your buttocks.  3. Slowly lift your chest off of the floor while you keep your hips pressed to the floor. Keep the back of your head in line with the curve in your back. Your eyes should be looking at the floor. 4. Hold this position for 3-5  seconds. 5. Slowly return to your starting position.  Contact a health care provider if:  Your back pain or discomfort gets much worse when you do an exercise.  Your back pain or discomfort does not lessen within 2 hours after you exercise. If you have any of these problems, stop doing these exercises right away. Do not do them again unless your health care provider says that you can. Get help right away if:  You develop sudden, severe back pain. If this happens, stop doing the exercises right away. Do not do them again unless your health care provider says that you can. This information is not intended to replace advice given to you by your health care provider. Make sure you discuss any questions you have with your health care provider. Document Released: 07/19/2004 Document Revised: 10/19/2015 Document Reviewed: 08/05/2014 Elsevier Interactive Patient Education  2017 [employer]  Cervical Strain and Sprain Rehab Ask your health care provider which exercises are safe for you. Do exercises exactly as told by your health care provider and adjust them as directed. It is normal to feel mild stretching, pulling, tightness, or discomfort as you do these exercises, but you should stop right away if you feel sudden pain or your pain gets worse.Do not begin these exercises until told by your health care provider. Stretching and range of motion exercises These exercises warm up your muscles and joints and improve the movement and flexibility of your neck. These exercises also help to relieve pain, numbness, and tingling. Exercise A: Cervical side bend  1. Using good posture, sit on a stable chair or stand up. 2. Without moving your shoulders, slowly tilt your left / right ear to your shoulder until you feel a stretch in your neck muscles. You should be looking straight ahead. 3. Hold for __________ seconds. 4. Repeat with the other side of your neck. Repeat __________ times. Complete this  exercise __________ times a day. Exercise B: Cervical rotation  1. Using good posture, sit on a stable chair or stand up. 2. Slowly turn your head to the side as if you are looking over your left / right shoulder. ? Keep your eyes level with the ground. ? Stop when you feel a stretch along the side and the back of your neck. 3. Hold for __________ seconds. 4. Repeat this by turning to your other side. Repeat __________ times. Complete this exercise __________ times a day. Exercise C: Thoracic extension and pectoral stretch 1. Roll a towel or a small blanket so it is about 4 inches (10 cm) in diameter. 2. Lie down on your back on a firm surface. 3. Put the towel lengthwise, under your spine in the middle of your back. It should not be not under your shoulder blades. The towel should line up with your spine from your middle back to your lower back. 4. Put your hands behind your head and let your elbows fall out to your sides. 5. Hold for __________ seconds. Repeat __________ times. Complete this exercise __________ times a day. Strengthening exercises These exercises build strength and endurance in your neck. Endurance is the ability to use your muscles for a long time, even after your muscles get tired. Exercise D:  Upper cervical flexion, isometric 1. Lie on your back with a thin pillow behind your head and a small rolled-up towel under your neck. 2. Gently tuck your chin toward your chest and nod your head down to look toward your feet. Do not lift your head off the pillow. 3. Hold for __________ seconds. 4. Release the tension slowly. Relax your neck muscles completely before you repeat this exercise. Repeat __________ times. Complete this exercise __________ times a day. Exercise E: Cervical extension, isometric  1. Stand about 6 inches (15 cm) away from a wall, with your back facing the wall. 2. Place a soft object, about 6-8 inches (15-20 cm) in diameter, between the back of your head  and the wall. A soft object could be a small pillow, a ball, or a folded towel. 3. Gently tilt your head back and press into the soft object. Keep your jaw and forehead relaxed. 4. Hold for __________ seconds. 5. Release the tension slowly. Relax your neck muscles completely before you repeat this exercise. Repeat __________ times. Complete this exercise __________ times a day. Posture and body mechanics  Body mechanics refers to the movements and positions of your body while you do your daily activities. Posture is part of body mechanics. Good posture and healthy body mechanics can help to relieve stress in your body's tissues and joints. Good posture means that your spine is in its natural S-curve position (your spine is neutral), your shoulders are pulled back slightly, and your head is not tipped forward. The following are general guidelines for applying improved posture and body mechanics to your everyday activities. Standing  When standing, keep your spine neutral and keep your feet about hip-width apart. Keep a slight bend in your knees. Your ears, shoulders, and hips should line up.  When you do a task in which you stand in one place for a long time, place one foot up on a stable object that is 2-4 inches (5-10 cm) high, such as a footstool. This helps keep your spine neutral. Sitting   When sitting, keep your spine neutral and your keep feet flat on the floor. Use a footrest, if necessary, and keep your thighs parallel to the floor. Avoid rounding your shoulders, and avoid tilting your head forward.  When working at a desk or a computer, keep your desk at a height where your hands are slightly lower than your elbows. Slide your chair under your desk so you are close enough to maintain good posture.  When working at a computer, place your monitor at a height where you are looking straight ahead and you do not have to tilt your head forward or downward to look at the screen. Resting When  lying down and resting, avoid positions that are most painful for you. Try to support your neck in a neutral position. You can use a contour pillow or a small rolled-up towel. Your pillow should support your neck but not push on it. This information is not intended to replace advice given to you by your health care provider. Make sure you discuss any questions you have with your health care provider. Document Released: 06/11/2005 Document Revised: 02/16/2016 Document Reviewed: 05/18/2015 Elsevier Interactive Patient Education  [employer].

## 2018-03-01 LAB — CBC WITH DIFFERENTIAL/PLATELET
Basophils Absolute: 112 cells/uL (ref 0–200)
Basophils Relative: 1.6 %
Eosinophils Absolute: 707 cells/uL — ABNORMAL HIGH (ref 15–500)
Eosinophils Relative: 10.1 %
HCT: 41.6 % (ref 38.5–50.0)
Hemoglobin: 14 g/dL (ref 13.2–17.1)
Lymphs Abs: 1428 cells/uL (ref 850–3900)
MCH: 29.8 pg (ref 27.0–33.0)
MCHC: 33.7 g/dL (ref 32.0–36.0)
MCV: 88.5 fL (ref 80.0–100.0)
MPV: 10.3 fL (ref 7.5–12.5)
Monocytes Relative: 8.4 %
NEUTROS PCT: 59.5 %
Neutro Abs: 4165 cells/uL (ref 1500–7800)
PLATELETS: 367 10*3/uL (ref 140–400)
RBC: 4.7 10*6/uL (ref 4.20–5.80)
RDW: 13 % (ref 11.0–15.0)
Total Lymphocyte: 20.4 %
WBC mixed population: 588 cells/uL (ref 200–950)
WBC: 7 10*3/uL (ref 3.8–10.8)

## 2018-03-01 LAB — COMPLETE METABOLIC PANEL WITH GFR
AG Ratio: 1.8 (calc) (ref 1.0–2.5)
ALT: 20 U/L (ref 9–46)
AST: 15 U/L (ref 10–35)
Albumin: 4.8 g/dL (ref 3.6–5.1)
Alkaline phosphatase (APISO): 47 U/L (ref 40–115)
BUN: 16 mg/dL (ref 7–25)
CALCIUM: 10.2 mg/dL (ref 8.6–10.3)
CHLORIDE: 101 mmol/L (ref 98–110)
CO2: 29 mmol/L (ref 20–32)
Creat: 0.95 mg/dL (ref 0.70–1.33)
GFR, EST AFRICAN AMERICAN: 105 mL/min/{1.73_m2} (ref >=60)
GFR, EST NON AFRICAN AMERICAN: 90 mL/min/{1.73_m2} (ref >=60)
GLUCOSE: 98 mg/dL (ref 65–99)
Globulin: 2.7 g/dL (calc) (ref 1.9–3.7)
Potassium: 4.7 mmol/L (ref 3.5–5.3)
Sodium: 138 mmol/L (ref 135–146)
TOTAL PROTEIN: 7.5 g/dL (ref 6.1–8.1)
Total Bilirubin: 0.5 mg/dL (ref 0.2–1.2)

## 2018-03-01 LAB — QUANTIFERON-TB GOLD PLUS
Mitogen-NIL: >10 IU/mL
NIL: 0.03 [IU]/mL
QuantiFERON-TB Gold Plus: NEGATIVE
TB1-NIL: 0 IU/mL
TB2-NIL: 0 [IU]/mL

## 2018-03-18 ENCOUNTER — Ambulatory Visit: Payer: BLUE CROSS/BLUE SHIELD | Admitting: Rheumatology

## 2018-05-19 ENCOUNTER — Telehealth: Payer: Self-pay | Admitting: Rheumatology
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
## 2018-06-16 NOTE — Progress Notes (Deleted)
Office Visit Note  Patient: Dale Curtis             Date of Birth: [birth year]           MRN: [id number]             PCP: Dale Curtis, [place] Referring: Dale Cunas, FNP Visit Date: 06/30/2018 Occupation: _0 @  Subjective:  No chief complaint on file.  Enbrel.  Last TB gold normal on 02/27/18.  Most recent CBC/CMP within normal limits on 02/27/18 and will monitor every 3 months.  Labs overdue and CBC/CMP ordered for today. Standing orders placed.  Recommend annual influenza, Pneumovax 23, Prevnar 13, and Shingrix as indicated.   History of Present Illness: Dale Curtis is a 57 y.o. male ***   Activities of Daily Living:  Patient reports morning stiffness for *** {minute/hour:19697}.   Patient {ACTIONS;DENIES/REPORTS:21021675::"Denies"} nocturnal pain.  Difficulty dressing/grooming: {ACTIONS;DENIES/REPORTS:21021675::"Denies"} Difficulty climbing stairs: {ACTIONS;DENIES/REPORTS:21021675::"Denies"} Difficulty getting out of chair: {ACTIONS;DENIES/REPORTS:21021675::"Denies"} Difficulty using hands for taps, buttons, cutlery, and/or writing: {ACTIONS;DENIES/REPORTS:21021675::"Denies"}  No Rheumatology ROS completed.   PMFS History:  Patient Active Problem List   Diagnosis Date Noted  . History of bipolar disorder 05/09/2017  . Sacroiliitis ([place]) 06/20/2016  . Uveitis 06/20/2016  . DJD (degenerative joint disease), cervical 06/20/2016  . 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 Primary osteoarthritis of both hands 06/20/2016  . HLA B27 (HLA B27 positive) 06/20/2016  . CHEST PAIN 01/22/2009  . SARCOIDOSIS, PULMONARY 01/30/2008  . GERD 10/18/2007  . URTICARIA 10/18/2007  . SPONDYLITIS, ANKYLOSING 10/18/2007    No past medical history on file.  Family History  Problem Relation Age of Onset  . COPD Mother   . Emphysema Mother   . Heart disease Mother   . Heart disease Father     . COPD Father   . Bipolar disorder Sister   . Ankylosing spondylitis Daughter    Past Surgical History:  Procedure Laterality Date  . BRONCHOSCOPY    . HERNIA REPAIR  2014   Social History   Social History Narrative  . Not on file    Objective: Vital Signs: There were no vitals taken for this visit.   Physical Exam   Musculoskeletal Exam: ***  CDAI Exam: CDAI Score: Not documented Patient Global Assessment: Not documented; Provider Global Assessment: Not documented Swollen: Not documented; Tender: Not documented Joint Exam   Not documented   There is currently no information documented on the homunculus. Go to the Rheumatology activity and complete the homunculus joint exam.  Investigation: No additional findings.  Imaging: No results found.  Recent Labs: Lab Results  Component Value Date   WBC 7.0 02/27/2018   HGB 14.0 02/27/2018   PLT 367 02/27/2018   NA 138 02/27/2018   K 4.7 02/27/2018   CL 101 02/27/2018   CO2 29 02/27/2018   GLUCOSE 98 02/27/2018   BUN 16 02/27/2018   CREATININE 0.95 02/27/2018   BILITOT 0.5 02/27/2018   ALKPHOS 66 01/31/2017   AST 15 02/27/2018   ALT 20 02/27/2018   PROT 7.5 02/27/2018   ALBUMIN 4.2 01/31/2017   CALCIUM 10.2 02/27/2018   GFRAA 105 02/27/2018   QFTBGOLD NEGATIVE 05/09/2017   QFTBGOLDPLUS NEGATIVE 02/27/2018    Speciality Comments: No specialty comments available.  Procedures:  No procedures performed Allergies: Patient has no known allergies.   Assessment / Plan:     Visit Diagnoses: No diagnosis found.  Orders: No orders of the defined types were placed in this encounter.  No orders of the defined types were placed in this encounter.   Face-to-face time spent with patient was *** minutes. Greater than 50% of time was spent in counseling and coordination of care.  Follow-Up Instructions: No follow-ups on file.   Earnestine Mealing, CMA  Note - This record has been created using Radio producer.  Chart creation errors have been sought, but may not always  have been located. Such creation errors do not reflect on  the standard of medical care.

## 2018-06-30 ENCOUNTER — Ambulatory Visit: Payer: BLUE CROSS/BLUE SHIELD | Admitting: Rheumatology
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
## 2018-07-31 ENCOUNTER — Ambulatory Visit (INDEPENDENT_AMBULATORY_CARE_PROVIDER_SITE_OTHER): Payer: BLUE CROSS/BLUE SHIELD | Admitting: Rheumatology

## 2018-07-31 ENCOUNTER — Encounter: Payer: Self-pay | Admitting: Rheumatology

## 2018-07-31 VITALS — BP 132/84 | HR 75 | Resp 15 | Ht 70.0 in | Wt 198.0 lb

## 2018-07-31 DIAGNOSIS — G8929 Other chronic pain: Secondary | ICD-10-CM

## 2018-07-31 DIAGNOSIS — M5136 Other intervertebral disc degeneration, lumbar region: Secondary | ICD-10-CM

## 2018-07-31 DIAGNOSIS — M450 Ankylosing spondylitis of multiple sites in spine: Secondary | ICD-10-CM

## 2018-07-31 DIAGNOSIS — Z79899 Other long term (current) drug therapy: Secondary | ICD-10-CM

## 2018-07-31 DIAGNOSIS — D869 Sarcoidosis, unspecified: Secondary | ICD-10-CM

## 2018-07-31 DIAGNOSIS — M461 Sacroiliitis, not elsewhere classified: Secondary | ICD-10-CM | POA: Diagnosis not present

## 2018-07-31 DIAGNOSIS — M19042 Primary osteoarthritis, left hand: Secondary | ICD-10-CM

## 2018-07-31 DIAGNOSIS — Z8659 Personal history of other mental and behavioral disorders: Secondary | ICD-10-CM

## 2018-07-31 DIAGNOSIS — Z1589 Genetic susceptibility to other disease: Secondary | ICD-10-CM

## 2018-07-31 DIAGNOSIS — Z8639 Personal history of other endocrine, nutritional and metabolic disease: Secondary | ICD-10-CM

## 2018-07-31 DIAGNOSIS — M545 Low back pain, unspecified: Secondary | ICD-10-CM

## 2018-07-31 DIAGNOSIS — M5030 Other cervical disc degeneration, unspecified cervical region: Secondary | ICD-10-CM

## 2018-07-31 DIAGNOSIS — M19041 Primary osteoarthritis, right hand: Secondary | ICD-10-CM

## 2018-07-31 DIAGNOSIS — M533 Sacrococcygeal disorders, not elsewhere classified: Secondary | ICD-10-CM

## 2018-07-31 DIAGNOSIS — M5134 Other intervertebral disc degeneration, thoracic region: Secondary | ICD-10-CM

## 2018-07-31 LAB — COMPLETE METABOLIC PANEL WITH GFR
AG Ratio: 1.8 (calc) (ref 1.0–2.5)
ALKALINE PHOSPHATASE (APISO): 43 U/L (ref 35–144)
ALT: 19 U/L (ref 9–46)
AST: 13 U/L (ref 10–35)
Albumin: 4.2 g/dL (ref 3.6–5.1)
BUN: 11 mg/dL (ref 7–25)
CHLORIDE: 103 mmol/L (ref 98–110)
CO2: 30 mmol/L (ref 20–32)
Calcium: 9.4 mg/dL (ref 8.6–10.3)
Creat: 0.8 mg/dL (ref 0.70–1.33)
GFR, Est African American: 117 mL/min/{1.73_m2} (ref >=60)
GFR, Est Non African American: 101 mL/min/{1.73_m2} (ref >=60)
Globulin: 2.4 g/dL (calc) (ref 1.9–3.7)
Glucose, Bld: 87 mg/dL (ref 65–99)
POTASSIUM: 4 mmol/L (ref 3.5–5.3)
Sodium: 138 mmol/L (ref 135–146)
Total Bilirubin: 0.5 mg/dL (ref 0.2–1.2)
Total Protein: 6.6 g/dL (ref 6.1–8.1)

## 2018-07-31 LAB — CBC WITH DIFFERENTIAL/PLATELET
Absolute Monocytes: 644 cells/uL (ref 200–950)
Basophils Absolute: 81 cells/uL (ref 0–200)
Basophils Relative: 1.1 %
EOS ABS: 363 {cells}/uL (ref 15–500)
Eosinophils Relative: 4.9 %
HCT: 36.2 % — ABNORMAL LOW (ref 38.5–50.0)
Hemoglobin: 12.5 g/dL — ABNORMAL LOW (ref 13.2–17.1)
Lymphs Abs: 1332 cells/uL (ref 850–3900)
MCH: 30.6 pg (ref 27.0–33.0)
MCHC: 34.5 g/dL (ref 32.0–36.0)
MCV: 88.5 fL (ref 80.0–100.0)
MPV: 10.3 fL (ref 7.5–12.5)
Monocytes Relative: 8.7 %
Neutro Abs: 4980 cells/uL (ref 1500–7800)
Neutrophils Relative %: 67.3 %
PLATELETS: 394 10*3/uL (ref 140–400)
RBC: 4.09 10*6/uL — ABNORMAL LOW (ref 4.20–5.80)
RDW: 12.5 % (ref 11.0–15.0)
Total Lymphocyte: 18 %
WBC: 7.4 10*3/uL (ref 3.8–10.8)

## 2018-07-31 MED ORDER — ETANERCEPT 50 MG/ML ~~LOC~~ SOAJ: 50.0000 mg | SUBCUTANEOUS | 0 refills | Status: DC

## 2018-07-31 MED ORDER — CYCLOBENZAPRINE HCL 10 MG PO TABS: ORAL_TABLET | ORAL | 0 refills | Status: DC

## 2018-08-01 NOTE — Progress Notes (Signed)
Anemia noted. Please inform patient and fax results to his PCP.

## 2018-09-12 ENCOUNTER — Telehealth: Payer: Self-pay | Admitting: Rheumatology

## 2018-09-12 NOTE — Telephone Encounter (Signed)
Patient called stating he stopped taking his Enbrel approximately 1 week ago due to the prescription weakening his immune system.  Patient is requesting a return call.

## 2018-09-12 NOTE — Telephone Encounter (Signed)
Returned patient call.  He is concerned about taking Enbrel which suppresses his immune system and also a diabetic increasing his risk of infection.  Informed patient we recommend you continue them at this time as prescribed. In the event you do become sick please follow recommended procedure to hold medication until symptoms have resolved.  We understand concerns about increased risk of infection. If you do choose to stop your medications understand you are putting yourself at risk for a flare which increases your risk of infection. Please call the office for any questions or acute issues. We are allowing some flexibly for labs and follow up visit.   All questions encouraged and answered.  Instructed patient to call with any further questions or concerns.  Verlin Fester, PharmD, [facility] Clinical Pharmacist  09/12/2018 3:43 PM

## 2018-11-20 ENCOUNTER — Telehealth: Payer: Self-pay | Admitting: Rheumatology

## 2018-11-20 NOTE — Telephone Encounter (Signed)
[facility] left a message 11/19/2018 4:54pm requesting prior auth for Enbrel Sureclick 50mg . Case # [id number]

## 2018-11-20 NOTE — Telephone Encounter (Signed)
Received a fax from [employer] regarding a prior authorization for ENBREL. Authorization has been APPROVED from 10/21/2018 to 11/20/2019.   Will send document to scan center.  Authorization # [id number] Phone # [phone]

## 2018-11-20 NOTE — Telephone Encounter (Signed)
Received a Prior Authorization request from [employer] for ENBREL. Authorization has been submitted to patient's insurance via Cover My Meds. Will update once we receive a response.

## 2019-01-06 NOTE — Progress Notes (Signed)
Office Visit Note  Patient: Dale Curtis             Date of Birth: 1961/05/27           MRN: [id number]             PCP: Amador Cunas, FNP Referring: Amador Cunas, FNP Visit Date: 01/20/2019 Occupation: @GUAROCC @  Subjective:  Neck pain    History of Present Illness: Dale Curtis is a 58 y.o. male with history of ankylosing spondylitis, DDD, and sarcoidosis. He is on Enbrel 50 mg sq injections every week. He has not had any recent flares. He continues to have chronic neck pain.  He denies any other joint pain or joint swelling.  He continues to take flexeril 10 mg po at bedtime as needed for muscle spasms.  He would like a refill of flexeril today.  He denies any sarcoidosis flares. He has no coughing or SOB.    Activities of Daily Living:  Patient reports morning stiffness for 0 minutes.   Patient Denies nocturnal pain.  Difficulty dressing/grooming: Denies Difficulty climbing stairs: Denies Difficulty getting out of chair: Denies Difficulty using hands for taps, buttons, cutlery, and/or writing: Denies  Review of Systems  Constitutional: Negative for fatigue and night sweats.  HENT: Negative for mouth sores, mouth dryness and nose dryness.   Eyes: Negative for redness and dryness.  Respiratory: Negative for cough, hemoptysis, shortness of breath and difficulty breathing.   Cardiovascular: Negative for chest pain, palpitations, hypertension, irregular heartbeat and swelling in legs/feet.  Gastrointestinal: Negative for blood in stool, constipation and diarrhea.  Endocrine: Negative for increased urination.  Genitourinary: Negative for painful urination.  Musculoskeletal: Positive for arthralgias and joint pain. Negative for joint swelling, myalgias, muscle weakness, morning stiffness, muscle tenderness and myalgias.  Skin: Negative for color change, rash, hair loss, nodules/bumps, skin tightness, ulcers and sensitivity to sunlight.  Allergic/Immunologic: Negative for  susceptible to infections.  Neurological: Negative for dizziness, fainting, memory loss, night sweats and weakness.  Hematological: Negative for swollen glands.  Psychiatric/Behavioral: Negative for depressed mood and sleep disturbance. The patient is not nervous/anxious.     PMFS History:  Patient Active Problem List   Diagnosis Date Noted  . History of bipolar disorder 05/09/2017  . Sacroiliitis ([place]) 06/20/2016  . Uveitis 06/20/2016  . DJD (degenerative joint disease), cervical 06/20/2016  . 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 Primary osteoarthritis of both hands 06/20/2016  . HLA B27 (HLA B27 positive) 06/20/2016  . CHEST PAIN 01/22/2009  . SARCOIDOSIS, PULMONARY 01/30/2008  . GERD 10/18/2007  . URTICARIA 10/18/2007  . SPONDYLITIS, ANKYLOSING 10/18/2007    History reviewed. No pertinent past medical history.  Family History  Problem Relation Age of Onset  . COPD Mother   . Emphysema Mother   . Heart disease Mother   . Heart disease Father   . COPD Father   . Bipolar disorder Sister   . Ankylosing spondylitis Daughter    Past Surgical History:  Procedure Laterality Date  . BRONCHOSCOPY    . HERNIA REPAIR  2014   Social History   Social History Narrative  . Not on file   Immunization History  Administered Date(s) Administered  . Influenza, Seasonal, Injecte, Preservative Fre 04/13/2016  . Influenza,inj,Quad PF,6+ Mos 02/28/2017  . Pneumococcal-Unspecified 07/25/2013  . Tdap 06/25/2010  . Zoster 04/22/2013     Objective: Vital Signs: BP 123/73 (BP Location: Left  Arm, Patient Position: Sitting, Cuff Size: Normal)   Pulse 84   Resp 12   Ht 5' 11"  (1.803 m)   Wt 174 lb (78.9 kg)   BMI 24.27 kg/m    Physical Exam Vitals signs and nursing note reviewed.  Constitutional:      Appearance: He is well-developed.  HENT:     Head: Normocephalic and atraumatic.   Eyes:     Conjunctiva/sclera: Conjunctivae normal.     Pupils: Pupils are equal, round, and reactive to light.  Neck:     Musculoskeletal: Normal range of motion and neck supple.  Cardiovascular:     Rate and Rhythm: Normal rate and regular rhythm.     Heart sounds: Normal heart sounds.  Pulmonary:     Effort: Pulmonary effort is normal.     Breath sounds: Normal breath sounds.  Abdominal:     General: Bowel sounds are normal.     Palpations: Abdomen is soft.  Skin:    General: Skin is warm and dry.     Capillary Refill: Capillary refill takes less than 2 seconds.  Neurological:     Mental Status: He is alert and oriented to person, place, and time.  Psychiatric:        Behavior: Behavior normal.      Musculoskeletal Exam: C-spine slightly limited ROM with some discomfort.  Thoracic and lumbar spine good ROM. No midline spinal tenderness.  No SI joint tenderness.  Shoulder joints, elbow joints, wrist joints, MCPs, PIPs, and DIPs good ROM with no synovitis.  PIP and DIP synovial thickening.  Hip joints, knee joints, ankle joints, MTPs, PIPs, and DIPs good ROM with no synovitis.  No warmth or effusion of knee joints.  No tenderness or inflammation of ankle joints.    CDAI Exam: CDAI Score: - Patient Global: -; Provider Global: - Swollen: -; Tender: - Joint Exam   No joint exam has been documented for this visit   There is currently no information documented on the homunculus. Go to the Rheumatology activity and complete the homunculus joint exam.  Investigation: No additional findings.  Imaging: No results found.  Recent Labs: Lab Results  Component Value Date   WBC 7.4 07/31/2018   HGB 12.5 (L) 07/31/2018   PLT 394 07/31/2018   NA 138 07/31/2018   K 4.0 07/31/2018   CL 103 07/31/2018   CO2 30 07/31/2018   GLUCOSE 87 07/31/2018   BUN 11 07/31/2018   CREATININE 0.80 07/31/2018   BILITOT 0.5 07/31/2018   ALKPHOS 66 01/31/2017   AST 13 07/31/2018   ALT 19  07/31/2018   PROT 6.6 07/31/2018   ALBUMIN 4.2 01/31/2017   CALCIUM 9.4 07/31/2018   GFRAA 117 07/31/2018   QFTBGOLD NEGATIVE 05/09/2017   QFTBGOLDPLUS NEGATIVE 02/27/2018    Speciality Comments: No specialty comments available.  Procedures:  No procedures performed Allergies: Patient has no known allergies.   Assessment / Plan:     Visit Diagnoses: Ankylosing spondylitis of multiple sites in spine [facility]) -He has not had any signs or symptoms of a flare recently.  He is clinically doing well on Enbrel 50 mg sq injections once weekly. He has chronic neck pain and stiffness.  He has no symptoms of radiculopathy at this time. He has good ROM of thoracic and lumbar spine.  He has no midline spinal tenderness.  No SI joint tenderness on exam.  He has no synovitis on exam.  He will continue on this current treatment regimen.  Lab work will be updated today.  A refill of Enbrel will be sent in once lab work has resulted.  He was advised to notify us if he develops any signs or symptoms of a flare.  He will follow up in 5 months.   HLA B27 (HLA B27 positive)   Sacroiliitis ([facility]) - He has no SI joint tenderness.    High risk medication use - Enbrel SureClick 50 mg every 7 days.  Last TB gold negative on 02/27/2018 and will monitor yearly.  Future order for TB Gold placed.  Most recent CBC/CMP within normal limits except for anemia on 07/31/2018.  He is overdue for labs.  CBC/CMP ordered for today and will monitor every 3 months.  Standing orders placed.  He was advised to hold Enbrel if he develops any signs or symptoms of an infection and to resume once the infection has cleared. - Plan: CBC with Differential/Platelet, COMPLETE METABOLIC PANEL WITH GFR, CBC with Differential/Platelet, COMPLETE METABOLIC PANEL WITH GFR, QuantiFERON-TB Gold Plus  Chronic midline low back pain without sciatica - Findings were consistent with facet joint arthropathy -He has no midline spinal tenderness at this time.  No  symptoms of sciatica.   Chronic SI joint pain - He has no SI joint tenderness on exam.   Sarcoidosis - In remission.  He has not had any recent flares.  He has no shortness of breath or pulmonary symptoms at this time.   DDD (degenerative disc disease), cervical - He has chronic neck pain and stiffness.  He has no symptoms of radiculopathy at this time.   DDD (degenerative disc disease), lumbar - Good ROM with no discomfort.  No midline spinal tenderness.    DDD (degenerative disc disease), thoracic - He has no midline spinal tenderness.  He has good ROM with no discomfort.   Primary osteoarthritis of both hands - He has PIP and DIP synovial thickening consistent with osteoarthritis.  He has no tenderness or synovitis on exam.  He has complete fist formation bilaterally.  Joint protection and muscle strengthening were discussed.   Other medical conditions are listed as follows:   History of diabetes mellitus   History of bipolar disorder   Orders: Orders Placed This Encounter  Procedures  . CBC with Differential/Platelet  . COMPLETE METABOLIC PANEL WITH GFR  . CBC with Differential/Platelet  . COMPLETE METABOLIC PANEL WITH GFR  . QuantiFERON-TB Gold Plus   Meds ordered this encounter  Medications  . cyclobenzaprine (FLEXERIL) 10 MG tablet    Sig: Take 1 tablet by mouth at bedtime as needed    Dispense:  30 tablet    Refill:  0      Follow-Up Instructions: Return in about 5 months (around 06/22/2019) for Ankylosing Spondylitis, Sarcoidosis, DDD.   Ofilia Neas, PA-C   I examined and evaluated the patient with Hazel Sams PA.  Patient had no synovitis on examination.  He had good range of motion of his cervical thoracic and lumbar spine.  No SI joint tenderness was noted.  He will continue Enbrel.  We will check labs today.  The plan of care was discussed as noted above.  Bo Merino, MD  Note - This record has been created using Editor, commissioning.  Chart creation  errors have been sought, but may not always  have been located. Such creation errors do not reflect on  the standard of medical care.

## 2019-01-20 ENCOUNTER — Other Ambulatory Visit: Payer: Self-pay

## 2019-01-20 ENCOUNTER — Ambulatory Visit (INDEPENDENT_AMBULATORY_CARE_PROVIDER_SITE_OTHER): Payer: BC Managed Care – PPO | Admitting: Rheumatology

## 2019-01-20 ENCOUNTER — Encounter: Payer: Self-pay | Admitting: Rheumatology

## 2019-01-20 VITALS — BP 123/73 | HR 84 | Resp 12 | Ht 71.0 in | Wt 174.0 lb

## 2019-01-20 DIAGNOSIS — M533 Sacrococcygeal disorders, not elsewhere classified: Secondary | ICD-10-CM

## 2019-01-20 DIAGNOSIS — M545 Low back pain, unspecified: Secondary | ICD-10-CM

## 2019-01-20 DIAGNOSIS — Z79899 Other long term (current) drug therapy: Secondary | ICD-10-CM

## 2019-01-20 DIAGNOSIS — M19041 Primary osteoarthritis, right hand: Secondary | ICD-10-CM

## 2019-01-20 DIAGNOSIS — M5136 Other intervertebral disc degeneration, lumbar region: Secondary | ICD-10-CM

## 2019-01-20 DIAGNOSIS — D869 Sarcoidosis, unspecified: Secondary | ICD-10-CM

## 2019-01-20 DIAGNOSIS — M5134 Other intervertebral disc degeneration, thoracic region: Secondary | ICD-10-CM

## 2019-01-20 DIAGNOSIS — M461 Sacroiliitis, not elsewhere classified: Secondary | ICD-10-CM | POA: Diagnosis not present

## 2019-01-20 DIAGNOSIS — M450 Ankylosing spondylitis of multiple sites in spine: Secondary | ICD-10-CM

## 2019-01-20 DIAGNOSIS — Z1589 Genetic susceptibility to other disease: Secondary | ICD-10-CM

## 2019-01-20 DIAGNOSIS — Z8659 Personal history of other mental and behavioral disorders: Secondary | ICD-10-CM

## 2019-01-20 DIAGNOSIS — M5030 Other cervical disc degeneration, unspecified cervical region: Secondary | ICD-10-CM

## 2019-01-20 DIAGNOSIS — G8929 Other chronic pain: Secondary | ICD-10-CM

## 2019-01-20 DIAGNOSIS — Z8639 Personal history of other endocrine, nutritional and metabolic disease: Secondary | ICD-10-CM

## 2019-01-20 DIAGNOSIS — M19042 Primary osteoarthritis, left hand: Secondary | ICD-10-CM

## 2019-01-20 MED ORDER — ENBREL MINI 50 MG/ML ~~LOC~~ SOCT: 50.0000 mg | SUBCUTANEOUS | 0 refills | Status: DC

## 2019-01-20 MED ORDER — CYCLOBENZAPRINE HCL 10 MG PO TABS: ORAL_TABLET | ORAL | 0 refills | Status: DC

## 2019-01-20 NOTE — Progress Notes (Signed)
Pharmacy Note  Subjective:  Patient presents today to the [facility] to see Dr. Estanislado Pandy.   Patient seen by the pharmacist for counseling on Enbrel Mini injection technique  Objective: Current Outpatient Medications on File Prior to Visit  Medication Sig Dispense Refill  . amphetamine-dextroamphetamine (ADDERALL) 30 MG tablet Take by mouth.    . ARIPiprazole (ABILIFY) 10 MG tablet     . aspirin 81 MG tablet Take 81 mg by mouth daily.    . divalproex (DEPAKOTE) 250 MG DR tablet Take 250 mg by mouth daily.    Marland Kitchen glipiZIDE (GLUCOTROL) 5 MG tablet Take 5 mg by mouth at bedtime.     . mesalamine (LIALDA) 1.2 g EC tablet Take 1.2 g daily with breakfast by mouth.    . pantoprazole (PROTONIX) 40 MG tablet Take 40 mg by mouth daily.     . rosuvastatin (CRESTOR) 5 MG tablet Take 5 mg by mouth every other day.    . sertraline (ZOLOFT) 100 MG tablet Take 200 mg by mouth daily.     Marland Kitchen SYNJARDY XR 10-998 MG TB24 TK 2 TS PO QD     No current facility-administered medications on file prior to visit.      Assessment/Plan:  Demonstrated proper injection technique with Enbrel Mini demo pen. Patient able to demonstrate proper injection technique using the teach back method.    He agrees to switch to the Enbrel Mini at this time.  He was given an autotouch injector in office and new prescription for mini cartridge sent to pharmacy.  All questions encouraged and answered.  Instructed patient to call with any further questions or concerns.  Mariella Saa, PharmD, [place], [place] Clinical Specialty Pharmacist [phone]  01/20/2019 4:37 PM

## 2019-01-20 NOTE — Patient Instructions (Signed)
Standing Labs We placed an order today for your standing lab work.    Please come back and get your standing labs in October and every 3 months   We have open lab daily Monday through Thursday from 8:30-12:30 PM and 1:30-4:30 PM and Friday from 8:30-12:30 PM and 1:30 -4:00 PM at the office of Dr. Shaili Deveshwar.   You may experience shorter wait times on Monday and Friday afternoons. The office is located at [address], [address], [place], [place] [address] No appointment is necessary.   Labs are drawn by [facility].  You may receive a bill from [facility] for your lab work.  If you wish to have your labs drawn at another location, please call the office 24 hours in advance to send orders.  If you have any questions regarding directions or hours of operation,  please call [phone].   Just as a reminder please drink plenty of water prior to coming for your lab work. Thanks!   

## 2019-01-21 LAB — CBC WITH DIFFERENTIAL/PLATELET
Absolute Monocytes: 613 cells/uL (ref 200–950)
Basophils Absolute: 59 cells/uL (ref 0–200)
Basophils Relative: 0.7 %
Eosinophils Absolute: 269 cells/uL (ref 15–500)
Eosinophils Relative: 3.2 %
HCT: 38.5 % (ref 38.5–50.0)
Hemoglobin: 13.3 g/dL (ref 13.2–17.1)
Lymphs Abs: 1655 cells/uL (ref 850–3900)
MCH: 31.3 pg (ref 27.0–33.0)
MCHC: 34.5 g/dL (ref 32.0–36.0)
MCV: 90.6 fL (ref 80.0–100.0)
MPV: 10.3 fL (ref 7.5–12.5)
Monocytes Relative: 7.3 %
Neutro Abs: 5804 cells/uL (ref 1500–7800)
Neutrophils Relative %: 69.1 %
Platelets: 377 10*3/uL (ref 140–400)
RBC: 4.25 10*6/uL (ref 4.20–5.80)
RDW: 12.6 % (ref 11.0–15.0)
Total Lymphocyte: 19.7 %
WBC: 8.4 10*3/uL (ref 3.8–10.8)

## 2019-01-21 LAB — COMPLETE METABOLIC PANEL WITH GFR
AG Ratio: 2 (calc) (ref 1.0–2.5)
ALT: 36 U/L (ref 9–46)
AST: 14 U/L (ref 10–35)
Albumin: 4.5 g/dL (ref 3.6–5.1)
Alkaline phosphatase (APISO): 57 U/L (ref 35–144)
BUN: 18 mg/dL (ref 7–25)
CO2: 28 mmol/L (ref 20–32)
Calcium: 9.9 mg/dL (ref 8.6–10.3)
Chloride: 101 mmol/L (ref 98–110)
Creat: 0.93 mg/dL (ref 0.70–1.33)
GFR, Est African American: 107 mL/min/{1.73_m2} (ref >=60)
GFR, Est Non African American: 92 mL/min/{1.73_m2} (ref >=60)
Globulin: 2.2 g/dL (calc) (ref 1.9–3.7)
Glucose, Bld: 247 mg/dL — ABNORMAL HIGH (ref 65–99)
Potassium: 4.1 mmol/L (ref 3.5–5.3)
Sodium: 137 mmol/L (ref 135–146)
Total Bilirubin: 0.3 mg/dL (ref 0.2–1.2)
Total Protein: 6.7 g/dL (ref 6.1–8.1)

## 2019-01-21 NOTE — Progress Notes (Signed)
Glucose is elevated-247. Rest of CMP WNL.  CBC WNL.

## 2019-06-24 ENCOUNTER — Ambulatory Visit: Payer: BC Managed Care – PPO | Admitting: Rheumatology

## 2023-03-07 NOTE — Progress Notes (Deleted)
Office Visit Note  Patient: Dale Curtis             Date of Birth: 1960/08/18           MRN: [id number]             PCP: Loleta Dicker, FNP Referring: Deloris Ping, * Visit Date: 03/15/2023 Occupation: @GUAROCC @  Subjective:  No chief complaint on file.   History of Present Illness: Dale Curtis is a 62 y.o. male ***     Activities of Daily Living:  Patient reports morning stiffness for *** {minute/hour:19697}.   Patient {ACTIONS;DENIES/REPORTS:21021675::"Denies"} nocturnal pain.  Difficulty dressing/grooming: {ACTIONS;DENIES/REPORTS:21021675::"Denies"} Difficulty climbing stairs: {ACTIONS;DENIES/REPORTS:21021675::"Denies"} Difficulty getting out of chair: {ACTIONS;DENIES/REPORTS:21021675::"Denies"} Difficulty using hands for taps, buttons, cutlery, and/or writing: {ACTIONS;DENIES/REPORTS:21021675::"Denies"}  No Rheumatology ROS completed.   PMFS History:  Patient Active Problem List   Diagnosis Date Noted   History of bipolar disorder 05/09/2017   Sacroiliitis ([facility]) 06/20/2016   Uveitis 06/20/2016   DJD (degenerative joint disease), cervical 06/20/2016   Spondylosis of lumbar region without myelopathy or radiculopathy 06/20/2016   DDD (degenerative disc disease), thoracic 06/20/2016   Primary osteoarthritis of both hands 06/20/2016   HLA B27 (HLA B27 positive) 06/20/2016   CHEST PAIN 01/22/2009   SARCOIDOSIS, PULMONARY 01/30/2008   GERD 10/18/2007   URTICARIA 10/18/2007   SPONDYLITIS, ANKYLOSING 10/18/2007    No past medical history on file.  Family History  Problem Relation Age of Onset   COPD Mother    Emphysema Mother    Heart disease Mother    Heart disease Father    COPD Father    Bipolar disorder Sister    Ankylosing spondylitis Daughter    Past Surgical History:  Procedure Laterality Date   BRONCHOSCOPY     HERNIA REPAIR  2014   Social History   Social History Narrative   Not on file   Immunization History  Administered  Date(s) Administered   Influenza, Seasonal, Injecte, Preservative Fre 04/13/2016   Influenza,inj,Quad PF,6+ Mos 02/28/2017   Pneumococcal-Unspecified 07/25/2013   Tdap 06/25/2010   Zoster, Live 04/22/2013     Objective: Vital Signs: There were no vitals taken for this visit.   Physical Exam   Musculoskeletal Exam: ***  CDAI Exam: CDAI Score: -- Patient Global: --; Provider Global: -- Swollen: --; Tender: -- Joint Exam 03/15/2023   No joint exam has been documented for this visit   There is currently no information documented on the homunculus. Go to the Rheumatology activity and complete the homunculus joint exam.  Investigation: No additional findings.  Imaging: No results found.  Recent Labs: Lab Results  Component Value Date   WBC 8.4 01/20/2019   HGB 13.3 01/20/2019   PLT 377 01/20/2019   NA 137 01/20/2019   K 4.1 01/20/2019   CL 101 01/20/2019   CO2 28 01/20/2019   GLUCOSE 247 (H) 01/20/2019   BUN 18 01/20/2019   CREATININE 0.93 01/20/2019   BILITOT 0.3 01/20/2019   ALKPHOS 66 01/31/2017   AST 14 01/20/2019   ALT 36 01/20/2019   PROT 6.7 01/20/2019   ALBUMIN 4.2 01/31/2017   CALCIUM 9.9 01/20/2019   GFRAA 107 01/20/2019   QFTBGOLD NEGATIVE 05/09/2017   QFTBGOLDPLUS NEGATIVE 02/27/2018    Speciality Comments: No specialty comments available.  Procedures:  No procedures performed Allergies: Patient has no known allergies.   Assessment / Plan:     Visit Diagnoses: Ankylosing spondylitis of multiple sites in spine [facility]) - Last seen 01/20/19-on  enbrel  HLA B27 (HLA B27 positive)  High risk medication use - Enbrel SureClick 50 mg every 7 days.  Sacroiliitis ([facility])  Sarcoidosis - In remission  DDD (degenerative disc disease), cervical  DDD (degenerative disc disease), thoracic  DDD (degenerative disc disease), lumbar  Primary osteoarthritis of both hands  History of diabetes mellitus  History of bipolar disorder  Orders: No orders of  the defined types were placed in this encounter.  No orders of the defined types were placed in this encounter.   Face-to-face time spent with patient was *** minutes. Greater than 50% of time was spent in counseling and coordination of care.  Follow-Up Instructions: No follow-ups on file.   Gearldine Bienenstock, PA-C  Note - This record has been created using Dragon software.  Chart creation errors have been sought, but may not always  have been located. Such creation errors do not reflect on  the standard of medical care.

## 2023-03-15 ENCOUNTER — Encounter: Payer: BC Managed Care – PPO | Admitting: Rheumatology

## 2023-03-15 DIAGNOSIS — M5030 Other cervical disc degeneration, unspecified cervical region: Secondary | ICD-10-CM

## 2023-03-15 DIAGNOSIS — Z8719 Personal history of other diseases of the digestive system: Secondary | ICD-10-CM

## 2023-03-15 DIAGNOSIS — M450 Ankylosing spondylitis of multiple sites in spine: Secondary | ICD-10-CM

## 2023-03-15 DIAGNOSIS — M5136 Other intervertebral disc degeneration, lumbar region: Secondary | ICD-10-CM

## 2023-03-15 DIAGNOSIS — Z8659 Personal history of other mental and behavioral disorders: Secondary | ICD-10-CM

## 2023-03-15 DIAGNOSIS — G4733 Obstructive sleep apnea (adult) (pediatric): Secondary | ICD-10-CM

## 2023-03-15 DIAGNOSIS — D869 Sarcoidosis, unspecified: Secondary | ICD-10-CM

## 2023-03-15 DIAGNOSIS — M461 Sacroiliitis, not elsewhere classified: Secondary | ICD-10-CM

## 2023-03-15 DIAGNOSIS — Z1589 Genetic susceptibility to other disease: Secondary | ICD-10-CM

## 2023-03-15 DIAGNOSIS — Z79899 Other long term (current) drug therapy: Secondary | ICD-10-CM

## 2023-03-15 DIAGNOSIS — M19041 Primary osteoarthritis, right hand: Secondary | ICD-10-CM

## 2023-03-15 DIAGNOSIS — M5134 Other intervertebral disc degeneration, thoracic region: Secondary | ICD-10-CM

## 2023-03-15 DIAGNOSIS — Z8639 Personal history of other endocrine, nutritional and metabolic disease: Secondary | ICD-10-CM

## 2023-04-09 ENCOUNTER — Ambulatory Visit: Payer: BC Managed Care – PPO | Admitting: Rheumatology
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
## 2023-08-15 ENCOUNTER — Encounter: Payer: Self-pay | Admitting: Rheumatology

## 2023-08-15 ENCOUNTER — Ambulatory Visit: Payer: BC Managed Care – PPO

## 2023-08-15 ENCOUNTER — Ambulatory Visit: Payer: BC Managed Care – PPO | Attending: Rheumatology | Admitting: Rheumatology

## 2023-08-15 ENCOUNTER — Telehealth: Payer: Self-pay | Admitting: Pharmacist

## 2023-08-15 VITALS — BP 123/84 | HR 77 | Resp 14 | Ht 70.5 in | Wt 176.0 lb

## 2023-08-15 DIAGNOSIS — G8929 Other chronic pain: Secondary | ICD-10-CM | POA: Diagnosis not present

## 2023-08-15 DIAGNOSIS — M25511 Pain in right shoulder: Secondary | ICD-10-CM

## 2023-08-15 DIAGNOSIS — K5120 Ulcerative (chronic) proctitis without complications: Secondary | ICD-10-CM

## 2023-08-15 DIAGNOSIS — Z8709 Personal history of other diseases of the respiratory system: Secondary | ICD-10-CM | POA: Diagnosis not present

## 2023-08-15 DIAGNOSIS — Z1589 Genetic susceptibility to other disease: Secondary | ICD-10-CM | POA: Diagnosis not present

## 2023-08-15 DIAGNOSIS — M25562 Pain in left knee: Secondary | ICD-10-CM

## 2023-08-15 DIAGNOSIS — M5134 Other intervertebral disc degeneration, thoracic region: Secondary | ICD-10-CM

## 2023-08-15 DIAGNOSIS — Z8659 Personal history of other mental and behavioral disorders: Secondary | ICD-10-CM

## 2023-08-15 DIAGNOSIS — D869 Sarcoidosis, unspecified: Secondary | ICD-10-CM

## 2023-08-15 DIAGNOSIS — M19042 Primary osteoarthritis, left hand: Secondary | ICD-10-CM

## 2023-08-15 DIAGNOSIS — Z87891 Personal history of nicotine dependence: Secondary | ICD-10-CM

## 2023-08-15 DIAGNOSIS — M5030 Other cervical disc degeneration, unspecified cervical region: Secondary | ICD-10-CM

## 2023-08-15 DIAGNOSIS — M461 Sacroiliitis, not elsewhere classified: Secondary | ICD-10-CM | POA: Diagnosis not present

## 2023-08-15 DIAGNOSIS — Z8719 Personal history of other diseases of the digestive system: Secondary | ICD-10-CM

## 2023-08-15 DIAGNOSIS — Z79899 Other long term (current) drug therapy: Secondary | ICD-10-CM

## 2023-08-15 DIAGNOSIS — M545 Low back pain, unspecified: Secondary | ICD-10-CM

## 2023-08-15 DIAGNOSIS — M51369 Other intervertebral disc degeneration, lumbar region without mention of lumbar back pain or lower extremity pain: Secondary | ICD-10-CM

## 2023-08-15 DIAGNOSIS — K219 Gastro-esophageal reflux disease without esophagitis: Secondary | ICD-10-CM

## 2023-08-15 DIAGNOSIS — H209 Unspecified iridocyclitis: Secondary | ICD-10-CM | POA: Diagnosis not present

## 2023-08-15 DIAGNOSIS — Z8639 Personal history of other endocrine, nutritional and metabolic disease: Secondary | ICD-10-CM

## 2023-08-15 DIAGNOSIS — M450 Ankylosing spondylitis of multiple sites in spine: Secondary | ICD-10-CM | POA: Diagnosis not present

## 2023-08-15 DIAGNOSIS — M19041 Primary osteoarthritis, right hand: Secondary | ICD-10-CM

## 2023-08-15 DIAGNOSIS — E782 Mixed hyperlipidemia: Secondary | ICD-10-CM

## 2023-08-15 MED ORDER — TRIAMCINOLONE ACETONIDE 40 MG/ML IJ SUSP
40.0000 mg | INTRAMUSCULAR | Status: AC | PRN
  Administered 2023-08-15: 40 mg via INTRA_ARTICULAR

## 2023-08-15 MED ORDER — LIDOCAINE HCL 1 % IJ SOLN
1.0000 mL | INTRAMUSCULAR | Status: AC | PRN
  Administered 2023-08-15: 1 mL

## 2023-08-15 NOTE — Progress Notes (Signed)
Pharmacy Note Subjective: Patient presents today to [facility] for follow up office visit. Patient seen by the pharmacist for counseling on Humira for sarcoidosis, ankylosing spondylitis, and ulcerative colitis .  Prior therapy includes: Enbrel.  Diagnosis of heart failure: No  Objective:  CBC    Component Value Date/Time   WBC 8.4 01/20/2019 1616   RBC 4.25 01/20/2019 1616   HGB 13.3 01/20/2019 1616   HCT 38.5 01/20/2019 1616   PLT 377 01/20/2019 1616   MCV 90.6 01/20/2019 1616   MCH 31.3 01/20/2019 1616   MCHC 34.5 01/20/2019 1616   RDW 12.6 01/20/2019 1616   LYMPHSABS 1,655 01/20/2019 1616   MONOABS 738 01/31/2017 1010   EOSABS 269 01/20/2019 1616   BASOSABS 59 01/20/2019 1616     CMP     Component Value Date/Time   NA 137 01/20/2019 1616   K 4.1 01/20/2019 1616   CL 101 01/20/2019 1616   CO2 28 01/20/2019 1616   GLUCOSE 247 (H) 01/20/2019 1616   BUN 18 01/20/2019 1616   CREATININE 0.93 01/20/2019 1616   CALCIUM 9.9 01/20/2019 1616   PROT 6.7 01/20/2019 1616   ALBUMIN 4.2 01/31/2017 1010   AST 14 01/20/2019 1616   ALT 36 01/20/2019 1616   ALKPHOS 66 01/31/2017 1010   BILITOT 0.3 01/20/2019 1616   GFRNONAA 92 01/20/2019 1616   GFRAA 107 01/20/2019 1616      Baseline Immunosuppressant Therapy Labs TB GOLD    Latest Ref Rng & Units 02/27/2018   12:14 PM  Quantiferon TB Gold  Quantiferon TB Gold Plus NEGATIVE NEGATIVE    Hepatitis Panel 07/04/2020 Hepatitis C Ab negative Hepatitis B surface antigen negative Hepatitis B Core Ab w reflex negative Hepatitis B surface antibody non reactive  HIV No results found for: "HIV" Immunoglobulins   SPEP    Latest Ref Rng & Units 01/20/2019    4:16 PM  Serum Protein Electrophoresis  Total Protein 6.1 - 8.1 g/dL 6.7    Chest x-ray: 1/61/0960 - Stable chest radiograph with findings compatible with history of sarcoidosis.  No acute findings in the chest.   Assessment/Plan:  Counseled patient that  Humira is a TNF blocking agent.  Counseled patient on purpose, proper use, and adverse effects of Humira.  Reviewed the most common adverse effects including infections, headache, and injection site reactions. Discussed that there is the possibility of an increased risk of malignancy including non-melanoma skin cancer but it is not well understood if this increased risk is due to the medication or the disease state.  Advised patient to get yearly dermatology exams due to risk of skin cancer. Counseled patient that Humira should be held prior to scheduled surgery.  Counseled patient to avoid live vaccines while on Humira.  Recommend annual influenza, PCV 15 or PCV20 or Pneumovax 23, and Shingrix as indicated.  Reviewed the importance of regular labs while on Humira therapy. Will monitor CBC and CMP 1 month after starting and then every 3 months routinely thereafter. Will monitor TB gold annually. Standing orders placed. Provided patient with medication education material and answered all questions.  Patient consented to Humira.  Will upload consent into the media tab.  Reviewed storage instructions of Humira.  Advised initial injection must be administered in office.  Patient verbalized understanding.   Dermatology referral will be placed at new start visit.  Repeat chest xray - ordered today.  Dose will be for ankylosing spondylitis Humira 40 mg every 14 days.  Prescription pending  lab results and/or insurance approval.   Discussed possibility that his insurance may not prefer Humira and may prefer biosimilar instead  Chesley Mires, PharmD, MPH, BCPS, CPP Clinical Pharmacist (Rheumatology and Pulmonology)

## 2023-08-15 NOTE — Patient Instructions (Addendum)
Please complete chest xray   Adalimumab Injection What is this medication? ADALIMUMAB (ay da LIM yoo mab) treats autoimmune conditions, such as psoriasis, arthritis, Crohn disease, and ulcerative colitis. It works by slowing down an overactive immune system.  It may also be used to treat hidradenitis suppurativa (HS). HS is a condition that causes painful lumps under the skin in areas such as the armpits and groin. It belongs to a group of medications called TNF inhibitors. It is a monoclonal antibody. This medicine may be used for other purposes; ask your health care provider or pharmacist if you have questions. COMMON BRAND NAME(S): ABRILADA, AMJEVITA, CYLTEZO, HADLIMA, Hulio, Hulio PEN, Humira, HUMIRA PEN, Hyrimoz, Idacio, Simlandi, Yuflyma, YUSIMRY What should I tell my care team before I take this medication? They need to know if you have any of these conditions: Cancer Diabetes (high blood sugar) Having surgery Heart disease Hepatitis B Immune system problems Infections, such as tuberculosis (TB) or other bacterial, fungal, or viral infections Multiple sclerosis Recent or upcoming vaccine An unusual or allergic reaction to adalimumab, mannitol, latex, rubber, other medications, foods, dyes, or preservatives Pregnant or trying to get pregnant Breast-feeding How should I use this medication? This medication is injected under the skin. It may be given by your care team in a hospital or clinic setting. It may also be given at home. If you get this medication at home, you will be taught how to prepare and give it. Use exactly as directed. Take it as directed on the prescription label. Keep taking it unless your care team tells you to stop. This medication comes with INSTRUCTIONS FOR USE. Ask your pharmacist for directions on how to use this medication. Read the information carefully. Talk to your pharmacist or care team if you have questions. It is important that you put your used needles  and syringes in a special sharps container. Do not put them in a trash can. If you do not have a sharps container, call your pharmacist or care team to get one. A special MedGuide will be given to you by the pharmacist with each prescription and refill. If you are getting this medication in a hospital or clinic, a special MedGuide will be given to you before each treatment. Be sure to read this information carefully each time. Talk to your care team about the use of this medication in children. While it be prescribed for children as young as 2 years for selected conditions, precautions do apply. Overdosage: If you think you have taken too much of this medicine contact a poison control center or emergency room at once. NOTE: This medicine is only for you. Do not share this medicine with others. What if I miss a dose? If you get this medication at the hospital or clinic: it is important not to miss your dose. Call your care team if you are unable to keep an appointment. If you give yourself this medication at home: If you miss a dose, take it as soon as you can. If it is almost time for your next dose, take only that dose. Do not take double or extra doses. Call your care team with questions. What may interact with this medication? Do not take this medication with any of the following: Abatacept Anakinra Biologic medications, such as certolizumab, etanercept, golimumab, infliximab Live virus vaccines This medication may also interact with the following: Cyclosporine Theophylline Vaccines Warfarin This list may not describe all possible interactions. Give your health care provider a list of  all the medicines, herbs, non-prescription drugs, or dietary supplements you use. Also tell them if you smoke, drink alcohol, or use illegal drugs. Some items may interact with your medicine. What should I watch for while using this medication? Visit your care team for regular checks on your progress. Tell your  care team if your symptoms do not start to get better or if they get worse. You will be tested for tuberculosis (TB) before you start this medication. If your care team prescribes any medication for TB, you should start taking the TB medication before starting this medication. Make sure to finish the full course of TB medication. This medication may increase your risk of getting an infection. Call your care team for advice if you get a fever, chills, sore throat, or other symptoms of a cold or flu. Do not treat yourself. Try to avoid being around people who are sick. Talk to your care team about your risk of cancer. You may be more at risk for certain types of cancer if you take this medication. What side effects may I notice from receiving this medication? Side effects that you should report to your care team as soon as possible: Allergic reactions--skin rash, itching, hives, swelling of the face, lips, tongue, or throat Aplastic anemia--unusual weakness or fatigue, dizziness, headache, trouble breathing, increased bleeding or bruising Body pain, tingling, or numbness Heart failure--shortness of breath, swelling of the ankles, feet, or hands, sudden weight gain, unusual weakness or fatigue Infection--fever, chills, cough, sore throat, wounds that don't heal, pain or trouble when passing urine, general feeling of discomfort or being unwell Lupus-like syndrome--joint pain, swelling, or stiffness, butterfly-shaped rash on the face, rashes that get worse in the sun, fever, unusual weakness or fatigue Unusual bruising or bleeding Side effects that usually do not require medical attention (report to your care team if they continue or are bothersome): Headache Nausea Pain, redness, or irritation at injection site Runny or stuffy nose Sore throat Stomach pain This list may not describe all possible side effects. Call your doctor for medical advice about side effects. You may report side effects to FDA  at 1-800-FDA-1088. Where should I keep my medication? Keep out of the reach of children and pets. Store in the refrigerator. Do not freeze. Keep this medication in the original packaging until you are ready to take it. Protect from light. Get rid of any unused medication after the expiration date. This medication may be stored at room temperature for up to 14 days. Keep this medication in the original packaging. Protect from light. If it is stored at room temperature, get rid of any unused medication after 14 days or after it expires, whichever is first. To get rid of medications that are no longer needed or have expired: Take the medication to a medication take-back program. Check with your pharmacy or law enforcement to find a location. If you cannot return the medication, ask your pharmacist or care team how to get rid of this medication safely. NOTE: This sheet is a summary. It may not cover all possible information. If you have questions about this medicine, talk to your doctor, pharmacist, or health care provider.  2024 Elsevier/Gold Standard (2023-05-24 00:00:00)  Standing Labs We placed an order today for your standing lab work.   Please have your standing labs drawn in in 1 month and then every 3 months  Please have your labs drawn 2 weeks prior to your appointment so that the provider can discuss your lab  results at your appointment, if possible.  Please note that you may see your imaging and lab results in MyChart before we have reviewed them. We will contact you once all results are reviewed. Please allow our office up to 72 hours to thoroughly review all of the results before contacting the office for clarification of your results.  WALK-IN LAB HOURS  Monday through Thursday from 8:00 am -12:30 pm and 1:00 pm-5:00 pm and Friday from 8:00 am-12:00 pm.  Patients with office visits requiring labs will be seen before walk-in labs.  You may encounter longer than normal wait times.  Please allow additional time. Wait times may be shorter on  Monday and Thursday afternoons.  We do not book appointments for walk-in labs. We appreciate your patience and understanding with our staff.   Labs are drawn by [facility]. Please bring your co-pay at the time of your lab draw.  You may receive a bill from [facility] for your lab work.  Please note if you are on Hydroxychloroquine and and an order has been placed for a Hydroxychloroquine level,  you will need to have it drawn 4 hours or more after your last dose.  If you wish to have your labs drawn at another location, please call the office 24 hours in advance so we can fax the orders.  The office is located at [address], [address], [place], [place] [address]   If you have any questions regarding directions or hours of operation,  please call [phone].   As a reminder, please drink plenty of water prior to coming for your lab work. Thanks!   Vaccines You are taking a medication(s) that can suppress your immune system.  The following immunizations are recommended: Flu annually Covid-19  RSV Td/Tdap (tetanus, diphtheria, pertussis) every 10 years Pneumonia (Prevnar 15 then Pneumovax 23 at least 1 year apart.  Alternatively, can take Prevnar 20 without needing additional dose) Shingrix: 2 doses from 4 weeks to 6 months apart  Please check with your PCP to make sure you are up to date.  If you have signs or symptoms of an infection or start antibiotics: First, call your PCP for workup of your infection. Hold your medication through the infection, until you complete your antibiotics, and until symptoms resolve if you take the following: Injectable medication (Actemra, Benlysta, Cimzia, Cosentyx, Enbrel, Humira, Kevzara, Orencia, Remicade, Simponi, Stelara, Taltz, Tremfya) Methotrexate Leflunomide (Arava) Mycophenolate (Cellcept) Harriette Ohara, Olumiant, or Rinvoq  Please get an annual skin examination to screen for skin cancer  while you are on Humira.  Please use sun screen and sun protection.

## 2023-08-15 NOTE — Telephone Encounter (Signed)
Pending OV note and labs from today, please start Humira BIV  Dose: 40mg  SQ every 14 days  Primary dx: AS Secondary dx: UC, sarcoidosis  If insurance prefers biosimilar, okay to move forward with biosimilar  Chesley Mires, PharmD, MPH, BCPS, CPP Clinical Pharmacist (Rheumatology and Pulmonology)

## 2023-08-16 ENCOUNTER — Other Ambulatory Visit (HOSPITAL_COMMUNITY): Payer: Self-pay

## 2023-08-16 NOTE — Telephone Encounter (Signed)
Received notification from [facility] regarding a prior authorization for HUMIRA. Authorization has been APPROVED from 06/15/2023 to 08/14/2024. Approval letter sent to scan center.  Unable to run test claim. Likely, patient can fill through [facility]: [phone]  Authorization # [id number]  Scheduling of Humira new start visit pending copay card information to populate in Complete Pro portal  Chesley Mires, PharmD, MPH, BCPS, CPP Clinical Pharmacist (Rheumatology and Pulmonology)

## 2023-08-16 NOTE — Progress Notes (Signed)
CBC, CMP, sed rate are within normal limits.  Except platelets are mildly elevated.  Will continue to monitor.  TB Gold is pending.

## 2023-08-16 NOTE — Telephone Encounter (Signed)
Submitted a Prior Authorization request to [facility] for HUMIRA via CoverMyMeds. Will update once we receive a response.  Key: [id number]

## 2023-08-18 LAB — COMPLETE METABOLIC PANEL WITH GFR
AG Ratio: 1.5 (calc) (ref 1.0–2.5)
ALT: 16 U/L (ref 9–46)
AST: 15 U/L (ref 10–35)
Albumin: 4.6 g/dL (ref 3.6–5.1)
Alkaline phosphatase (APISO): 76 U/L (ref 35–144)
BUN: 20 mg/dL (ref 7–25)
CO2: 30 mmol/L (ref 20–32)
Calcium: 10.1 mg/dL (ref 8.6–10.3)
Chloride: 100 mmol/L (ref 98–110)
Creat: 0.9 mg/dL (ref 0.70–1.35)
Globulin: 3.1 g/dL (ref 1.9–3.7)
Glucose, Bld: 93 mg/dL (ref 65–99)
Potassium: 4.7 mmol/L (ref 3.5–5.3)
Sodium: 138 mmol/L (ref 135–146)
Total Bilirubin: 0.5 mg/dL (ref 0.2–1.2)
Total Protein: 7.7 g/dL (ref 6.1–8.1)
eGFR: 98 mL/min/{1.73_m2} (ref >=60)

## 2023-08-18 LAB — CBC WITH DIFFERENTIAL/PLATELET
Absolute Lymphocytes: 1323 {cells}/uL (ref 850–3900)
Absolute Monocytes: 738 {cells}/uL (ref 200–950)
Basophils Absolute: 117 {cells}/uL (ref 0–200)
Basophils Relative: 1.3 %
Eosinophils Absolute: 423 {cells}/uL (ref 15–500)
Eosinophils Relative: 4.7 %
HCT: 47.5 % (ref 38.5–50.0)
Hemoglobin: 15.4 g/dL (ref 13.2–17.1)
MCH: 28.3 pg (ref 27.0–33.0)
MCHC: 32.4 g/dL (ref 32.0–36.0)
MCV: 87.2 fL (ref 80.0–100.0)
MPV: 9.9 fL (ref 7.5–12.5)
Monocytes Relative: 8.2 %
Neutro Abs: 6399 {cells}/uL (ref 1500–7800)
Neutrophils Relative %: 71.1 %
Platelets: 499 10*3/uL — ABNORMAL HIGH (ref 140–400)
RBC: 5.45 10*6/uL (ref 4.20–5.80)
RDW: 12.7 % (ref 11.0–15.0)
Total Lymphocyte: 14.7 %
WBC: 9 10*3/uL (ref 3.8–10.8)

## 2023-08-18 LAB — QUANTIFERON-TB GOLD PLUS
Mitogen-NIL: >10 [IU]/mL
NIL: 0.06 [IU]/mL
QuantiFERON-TB Gold Plus: NEGATIVE
TB1-NIL: 0.02 [IU]/mL
TB2-NIL: 0 [IU]/mL

## 2023-08-18 LAB — SEDIMENTATION RATE: Sed Rate: 19 mm/h (ref 0–20)

## 2023-08-19 NOTE — Progress Notes (Signed)
 TB Gold is negative.

## 2023-08-21 NOTE — Telephone Encounter (Signed)
 Patient enrolled into Humira copay card via Complete Pro portal: ID: [id number] Rx GROUP: [id number] Rx BIN: [id number] Rx PCN: [id number] Issued:08/19/2023  Pending chest x-ray to be reviewed by radiologist  Chesley Mires, PharmD, MPH, BCPS, CPP Clinical Pharmacist (Rheumatology and Pulmonology)

## 2023-08-22 NOTE — Progress Notes (Signed)
 Chest x-ray shows previous changes of sarcoidosis.  No new findings were noted per the radiology report.

## 2023-08-23 NOTE — Telephone Encounter (Signed)
 Received fax that patient must fill through [facility]

## 2023-08-23 NOTE — Telephone Encounter (Signed)
 ATC patient to schedule Humira new start. Unable to reach. If patient returns calls, okay to get him scheduled for new start (will be using sample)  Chesley Mires, PharmD, MPH, BCPS, CPP Clinical Pharmacist (Rheumatology and Pulmonology)

## 2023-08-26 NOTE — Telephone Encounter (Signed)
 Patient scheduled for Humira new start on 08/28/2023

## 2023-08-27 NOTE — Progress Notes (Unsigned)
 Pharmacy Note  Subjective:   Patient presents to clinic today to receive first dose of Humira for ankylosing spondylitis, ulcerative colitis, and sarcoidosis. Previously on Enbrel which was discontinued due to low disease activity  Patient running a fever or have signs/symptoms of infection? {yes/no:20286}  Patient currently on antibiotics for the treatment of infection? {yes/no:20286}  Patient have any upcoming invasive procedures/surgeries? {yes/no:20286}  Objective: CMP     Component Value Date/Time   NA 138 08/15/2023 1057   K 4.7 08/15/2023 1057   CL 100 08/15/2023 1057   CO2 30 08/15/2023 1057   GLUCOSE 93 08/15/2023 1057   BUN 20 08/15/2023 1057   CREATININE 0.90 08/15/2023 1057   CALCIUM 10.1 08/15/2023 1057   PROT 7.7 08/15/2023 1057   ALBUMIN 4.2 01/31/2017 1010   AST 15 08/15/2023 1057   ALT 16 08/15/2023 1057   ALKPHOS 66 01/31/2017 1010   BILITOT 0.5 08/15/2023 1057   GFRNONAA 92 01/20/2019 1616   GFRAA 107 01/20/2019 1616    CBC    Component Value Date/Time   WBC 9.0 08/15/2023 1057   RBC 5.45 08/15/2023 1057   HGB 15.4 08/15/2023 1057   HCT 47.5 08/15/2023 1057   PLT 499 (H) 08/15/2023 1057   MCV 87.2 08/15/2023 1057   MCH 28.3 08/15/2023 1057   MCHC 32.4 08/15/2023 1057   RDW 12.7 08/15/2023 1057   LYMPHSABS 1,655 01/20/2019 1616   MONOABS 738 01/31/2017 1010   EOSABS 423 08/15/2023 1057   BASOSABS 117 08/15/2023 1057    Baseline Immunosuppressant Therapy Labs TB GOLD    Latest Ref Rng & Units 08/15/2023   10:57 AM  Quantiferon TB Gold  Quantiferon TB Gold Plus NEGATIVE NEGATIVE    Hepatitis Panel 07/04/2020 Hepatitis C Ab negative Hepatitis B surface antigen negative Hepatitis B Core Ab w reflex negative Hepatitis B surface antibody non reactive  HIV No results found for: "HIV" Immunoglobulins   SPEP    Latest Ref Rng & Units 08/15/2023   10:57 AM  Serum Protein Electrophoresis  Total Protein 6.1 - 8.1 g/dL 7.7    Chest  x-ray: 1/61/0960 - Chronic lung changes in keeping with history of sarcoidosis. No superimposed acute process.   Assessment/Plan:  Reviewed importance of holding Humira with signs/symptoms of an infections, if antibiotics are prescribed to treat an active infection, and with invasive procedures  Demonstrated proper injection technique with Humira demo device  Patient able to demonstrate proper injection technique using the teach back method.  Patient self injected in the {injsitedsg:28167} with:  Sample Medication: Humira 40mg /0.38mL pen injector NDC: *** Lot: *** Expiration: ***  Patient tolerated well.  Observed for 30 mins in office for adverse reaction. {injectionreaction:30756}  Patient is to return in 1 month for labs and 6-8 weeks for follow-up appointment.  Standing orders for CBC/CMP and *** placed.  TB gold will be monitored yearly.  Referral to *** Dermatology placed today for yearly skin checks while on TNF inhibitor due to risk for non melanoma skin cancer  Humira approved through insurance .   Rx sent to: [facility]: [phone].  Patient provided with pharmacy phone number and advised to call later this week to schedule shipment to home.  Patient will continue Humira 40mg  subcut every 14 days in combination with ***.  All questions encouraged and answered.  Instructed patient to call with any further questions or concerns.  Chesley Mires, PharmD, MPH, BCPS, CPP Clinical Pharmacist (Rheumatology and Pulmonology)  08/27/2023 11:00 AM

## 2023-08-27 NOTE — Patient Instructions (Signed)
 Your next HUMIRA dose is due on 09/11/23, 09/25/2023, and every 14 days thereafter  {continuestartchange:25972}  HOLD HUMIRA if you have signs or symptoms of an infection. You can resume once you feel better or back to your baseline. HOLD HUMIRA if you start antibiotics to treat an infection. HOLD HUMIRA around the time of surgery/procedures. Your surgeon will be able to provide recommendations on when to hold BEFORE and when you are cleared to RESUME.  Pharmacy information: Your prescription will be shipped from [facility]. Their phone number is [phone]  Please call to schedule shipment and confirm address. They will mail your medication to your home.  Cost information: Your copay should be affordable. If you call the pharmacy and it is not affordable, please double-check that they are billing through your copay card as secondary coverage. That copay card information is: ID: [id number] Rx GROUP: [id number] Rx BIN: [id number] Rx PCN: [id number] Issued:08/19/2023  Labs are due in 1 month then every 3 months. Lab hours are from Monday to Thursday 8am-12:30pm and 1pm-5pm and Friday 8am-12pm. You do not need an appointment if you come for labs during these times. If you'd like to go to a [facility] or [facility] closer to home, please call our clinic 48 hours prior to lab date so we can release orders in a timely manner.  Stay up to date on all routine vaccines: influenza, pneumonia, COVID19, Shingles  How to manage an injection site reaction: Remember the 5 C's: COUNTER - leave on the counter at least 30 minutes but up to overnight to bring medication to room temperature. This may help prevent stinging COLD - place something cold (like an ice gel pack or cold water bottle) on the injection site just before cleansing with alcohol. This may help reduce pain CLARITIN - use Claritin (generic name is loratadine) for the first two weeks of treatment or the day of, the day before, and the day  after injecting. This will help to minimize injection site reactions CORTISONE CREAM - apply if injection site is irritated and itching CALL ME - if injection site reaction is bigger than the size of your fist, looks infected, blisters, or if you develop hives

## 2023-08-28 ENCOUNTER — Ambulatory Visit: Attending: Rheumatology | Admitting: Pharmacist

## 2023-08-28 DIAGNOSIS — Z79899 Other long term (current) drug therapy: Secondary | ICD-10-CM

## 2023-08-28 DIAGNOSIS — M450 Ankylosing spondylitis of multiple sites in spine: Secondary | ICD-10-CM

## 2023-08-28 DIAGNOSIS — D869 Sarcoidosis, unspecified: Secondary | ICD-10-CM

## 2023-08-28 DIAGNOSIS — Z7189 Other specified counseling: Secondary | ICD-10-CM

## 2023-08-28 DIAGNOSIS — H209 Unspecified iridocyclitis: Secondary | ICD-10-CM

## 2023-08-28 MED ORDER — HUMIRA (2 PEN) 40 MG/0.4ML ~~LOC~~ AJKT: 40.0000 mg | AUTO-INJECTOR | SUBCUTANEOUS | 0 refills | Status: DC
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
## 2023-09-11 ENCOUNTER — Encounter: Payer: Self-pay | Admitting: Rheumatology

## 2023-09-11 ENCOUNTER — Ambulatory Visit: Payer: BC Managed Care – PPO | Attending: Rheumatology | Admitting: Rheumatology

## 2023-09-11 VITALS — BP 127/81 | HR 92 | Ht 70.5 in | Wt 177.0 lb

## 2023-09-11 DIAGNOSIS — M51369 Other intervertebral disc degeneration, lumbar region without mention of lumbar back pain or lower extremity pain: Secondary | ICD-10-CM

## 2023-09-11 DIAGNOSIS — G8929 Other chronic pain: Secondary | ICD-10-CM

## 2023-09-11 DIAGNOSIS — M19041 Primary osteoarthritis, right hand: Secondary | ICD-10-CM

## 2023-09-11 DIAGNOSIS — K219 Gastro-esophageal reflux disease without esophagitis: Secondary | ICD-10-CM

## 2023-09-11 DIAGNOSIS — Z1589 Genetic susceptibility to other disease: Secondary | ICD-10-CM | POA: Diagnosis not present

## 2023-09-11 DIAGNOSIS — M450 Ankylosing spondylitis of multiple sites in spine: Secondary | ICD-10-CM

## 2023-09-11 DIAGNOSIS — M25511 Pain in right shoulder: Secondary | ICD-10-CM

## 2023-09-11 DIAGNOSIS — M19042 Primary osteoarthritis, left hand: Secondary | ICD-10-CM

## 2023-09-11 DIAGNOSIS — Z79899 Other long term (current) drug therapy: Secondary | ICD-10-CM

## 2023-09-11 DIAGNOSIS — E782 Mixed hyperlipidemia: Secondary | ICD-10-CM

## 2023-09-11 DIAGNOSIS — M25562 Pain in left knee: Secondary | ICD-10-CM

## 2023-09-11 DIAGNOSIS — Z8659 Personal history of other mental and behavioral disorders: Secondary | ICD-10-CM

## 2023-09-11 DIAGNOSIS — M5030 Other cervical disc degeneration, unspecified cervical region: Secondary | ICD-10-CM

## 2023-09-11 DIAGNOSIS — Z8639 Personal history of other endocrine, nutritional and metabolic disease: Secondary | ICD-10-CM

## 2023-09-11 DIAGNOSIS — D869 Sarcoidosis, unspecified: Secondary | ICD-10-CM

## 2023-09-11 DIAGNOSIS — M5134 Other intervertebral disc degeneration, thoracic region: Secondary | ICD-10-CM

## 2023-09-11 DIAGNOSIS — M461 Sacroiliitis, not elsewhere classified: Secondary | ICD-10-CM

## 2023-09-11 DIAGNOSIS — H209 Unspecified iridocyclitis: Secondary | ICD-10-CM

## 2023-09-11 DIAGNOSIS — Z87891 Personal history of nicotine dependence: Secondary | ICD-10-CM

## 2023-09-11 DIAGNOSIS — K5120 Ulcerative (chronic) proctitis without complications: Secondary | ICD-10-CM

## 2023-09-11 NOTE — Patient Instructions (Signed)
 Standing Labs We placed an order today for your standing lab work.   Please have your standing labs drawn in April and every 3 months  Please have your labs drawn 2 weeks prior to your appointment so that the provider can discuss your lab results at your appointment, if possible.  Please note that you may see your imaging and lab results in MyChart before we have reviewed them. We will contact you once all results are reviewed. Please allow our office up to 72 hours to thoroughly review all of the results before contacting the office for clarification of your results.  WALK-IN LAB HOURS  Monday through Thursday from 8:00 am -12:30 pm and 1:00 pm-5:00 pm and Friday from 8:00 am-12:00 pm.  Patients with office visits requiring labs will be seen before walk-in labs.  You may encounter longer than normal wait times. Please allow additional time. Wait times may be shorter on  Monday and Thursday afternoons.  We do not book appointments for walk-in labs. We appreciate your patience and understanding with our staff.   Labs are drawn by [facility]. Please bring your co-pay at the time of your lab draw.  You may receive a bill from [facility] for your lab work.  Please note if you are on Hydroxychloroquine and and an order has been placed for a Hydroxychloroquine level,  you will need to have it drawn 4 hours or more after your last dose.  If you wish to have your labs drawn at another location, please call the office 24 hours in advance so we can fax the orders.  The office is located at [address], [place], [place] [address]   If you have any questions regarding directions or hours of operation,  please call [phone].   As a reminder, please drink plenty of water prior to coming for your lab work. Thanks!   Vaccines You are taking a medication(s) that can suppress your immune system.  The following immunizations are recommended: Flu annually Covid-19  RSV Td/Tdap (tetanus,  diphtheria, pertussis) every 10 years Pneumonia (Prevnar 15 then Pneumovax 23 at least 1 year apart.  Alternatively, can take Prevnar 20 without needing additional dose) Shingrix: 2 doses from 4 weeks to 6 months apart  Please check with your PCP to make sure you are up to date.  If you have signs or symptoms of an infection or start antibiotics: First, call your PCP for workup of your infection. Hold your medication through the infection, until you complete your antibiotics, and until symptoms resolve if you take the following: Injectable medication (Actemra, Benlysta, Cimzia, Cosentyx, Enbrel, Humira, Kevzara, Orencia, Remicade, Simponi, Stelara, Taltz, Tremfya) Methotrexate Leflunomide (Arava) Mycophenolate (Cellcept) Harriette Ohara, Olumiant, or Rinvoq

## 2023-10-08 ENCOUNTER — Encounter: Payer: Self-pay | Admitting: Pharmacist

## 2023-10-08 NOTE — Telephone Encounter (Signed)
 error

## 2023-11-01 ENCOUNTER — Other Ambulatory Visit: Payer: Self-pay

## 2023-11-01 DIAGNOSIS — H209 Unspecified iridocyclitis: Secondary | ICD-10-CM

## 2023-11-01 DIAGNOSIS — M450 Ankylosing spondylitis of multiple sites in spine: Secondary | ICD-10-CM

## 2023-11-01 DIAGNOSIS — Z79899 Other long term (current) drug therapy: Secondary | ICD-10-CM

## 2023-11-01 MED ORDER — HUMIRA (2 PEN) 40 MG/0.4ML ~~LOC~~ AJKT: 40.0000 mg | AUTO-INJECTOR | SUBCUTANEOUS | 0 refills | Status: DC

## 2023-11-01 NOTE — Telephone Encounter (Signed)
 Last Fill: 08/28/2023  Labs: 08/15/2023 CBC, CMP, sed rate are within normal limits.  Except platelets are mildly elevated.   TB Gold: 08/15/2023 TB Gold is negative.   Next Visit: 12/18/2023  Last Visit: 09/11/2023  DX:Ankylosing spondylitis of multiple sites in spine   Current Dose per office note 09/11/2023: Humira 40 mg subcu every other week   Okay to refill Humira?

## 2023-11-19 LAB — HEMOGLOBIN A1C: Hemoglobin A1C: 8

## 2023-11-21 ENCOUNTER — Other Ambulatory Visit: Payer: Self-pay | Admitting: Medical Genetics

## 2023-11-25 ENCOUNTER — Other Ambulatory Visit (HOSPITAL_COMMUNITY)
Admission: RE | Admit: 2023-11-25 | Discharge: 2023-11-25 | Disposition: A | Discharge status: Home / Self Care | Payer: Self-pay | Source: Ambulatory Visit | Attending: Medical Genetics | Admitting: Medical Genetics

## 2023-12-03 LAB — GENECONNECT MOLECULAR SCREEN: Genetic Analysis Overall Interpretation: NEGATIVE
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
## 2023-12-13 ENCOUNTER — Telehealth: Payer: Self-pay | Admitting: Pharmacist

## 2023-12-13 NOTE — Telephone Encounter (Signed)
 Received fax from [facility] that patient's Humira  PA is set to expire before next fill however PA was active through 08/14/2024. Spoke with rep at pharmacy who requested fax of approval. She is unable to see why we received this fax. She states that if we receive another fax then we should cal [facility] backa nd request to speak with insurance department because they are able to see all adjudication details  Fax: [phone]  Geraldene Kleine, PharmD, MPH, BCPS, CPP Clinical Pharmacist (Rheumatology and Pulmonology)

## 2023-12-18 ENCOUNTER — Encounter: Payer: Self-pay | Admitting: Physician Assistant

## 2023-12-18 ENCOUNTER — Other Ambulatory Visit: Payer: Self-pay

## 2023-12-18 ENCOUNTER — Ambulatory Visit: Attending: Physician Assistant | Admitting: Physician Assistant

## 2023-12-18 VITALS — BP 123/81 | HR 89 | Resp 16 | Ht 70.5 in | Wt 180.0 lb

## 2023-12-18 DIAGNOSIS — Z1589 Genetic susceptibility to other disease: Secondary | ICD-10-CM

## 2023-12-18 DIAGNOSIS — Z79899 Other long term (current) drug therapy: Secondary | ICD-10-CM | POA: Diagnosis not present

## 2023-12-18 DIAGNOSIS — K219 Gastro-esophageal reflux disease without esophagitis: Secondary | ICD-10-CM

## 2023-12-18 DIAGNOSIS — H209 Unspecified iridocyclitis: Secondary | ICD-10-CM | POA: Diagnosis not present

## 2023-12-18 DIAGNOSIS — Z8659 Personal history of other mental and behavioral disorders: Secondary | ICD-10-CM

## 2023-12-18 DIAGNOSIS — G8929 Other chronic pain: Secondary | ICD-10-CM

## 2023-12-18 DIAGNOSIS — Z87891 Personal history of nicotine dependence: Secondary | ICD-10-CM

## 2023-12-18 DIAGNOSIS — Z8639 Personal history of other endocrine, nutritional and metabolic disease: Secondary | ICD-10-CM

## 2023-12-18 DIAGNOSIS — E782 Mixed hyperlipidemia: Secondary | ICD-10-CM

## 2023-12-18 DIAGNOSIS — R519 Headache, unspecified: Secondary | ICD-10-CM

## 2023-12-18 DIAGNOSIS — M5030 Other cervical disc degeneration, unspecified cervical region: Secondary | ICD-10-CM

## 2023-12-18 DIAGNOSIS — M461 Sacroiliitis, not elsewhere classified: Secondary | ICD-10-CM

## 2023-12-18 DIAGNOSIS — R413 Other amnesia: Secondary | ICD-10-CM

## 2023-12-18 DIAGNOSIS — D869 Sarcoidosis, unspecified: Secondary | ICD-10-CM

## 2023-12-18 DIAGNOSIS — M19042 Primary osteoarthritis, left hand: Secondary | ICD-10-CM

## 2023-12-18 DIAGNOSIS — M25511 Pain in right shoulder: Secondary | ICD-10-CM

## 2023-12-18 DIAGNOSIS — M450 Ankylosing spondylitis of multiple sites in spine: Secondary | ICD-10-CM

## 2023-12-18 DIAGNOSIS — K5120 Ulcerative (chronic) proctitis without complications: Secondary | ICD-10-CM

## 2023-12-18 DIAGNOSIS — M5134 Other intervertebral disc degeneration, thoracic region: Secondary | ICD-10-CM

## 2023-12-18 DIAGNOSIS — M19041 Primary osteoarthritis, right hand: Secondary | ICD-10-CM

## 2023-12-18 DIAGNOSIS — R479 Unspecified speech disturbances: Secondary | ICD-10-CM

## 2023-12-18 DIAGNOSIS — R200 Anesthesia of skin: Secondary | ICD-10-CM

## 2023-12-18 DIAGNOSIS — M25562 Pain in left knee: Secondary | ICD-10-CM

## 2023-12-18 DIAGNOSIS — M47816 Spondylosis without myelopathy or radiculopathy, lumbar region: Secondary | ICD-10-CM

## 2023-12-18 NOTE — Patient Instructions (Signed)

## 2023-12-18 NOTE — Progress Notes (Signed)
Referrals placed per Hazel Sams.

## 2023-12-19 ENCOUNTER — Ambulatory Visit: Payer: Self-pay | Admitting: Physician Assistant

## 2023-12-19 LAB — CBC WITH DIFFERENTIAL/PLATELET
Absolute Lymphocytes: 1426 {cells}/uL (ref 850–3900)
Absolute Monocytes: 641 {cells}/uL (ref 200–950)
Basophils Absolute: 86 {cells}/uL (ref 0–200)
Basophils Relative: 1.2 %
Eosinophils Absolute: 259 {cells}/uL (ref 15–500)
Eosinophils Relative: 3.6 %
HCT: 46 % (ref 38.5–50.0)
Hemoglobin: 15.6 g/dL (ref 13.2–17.1)
MCH: 30.3 pg (ref 27.0–33.0)
MCHC: 33.9 g/dL (ref 32.0–36.0)
MCV: 89.3 fL (ref 80.0–100.0)
MPV: 9.5 fL (ref 7.5–12.5)
Monocytes Relative: 8.9 %
Neutro Abs: 4788 {cells}/uL (ref 1500–7800)
Neutrophils Relative %: 66.5 %
Platelets: 376 10*3/uL (ref 140–400)
RBC: 5.15 10*6/uL (ref 4.20–5.80)
RDW: 13.4 % (ref 11.0–15.0)
Total Lymphocyte: 19.8 %
WBC: 7.2 10*3/uL (ref 3.8–10.8)

## 2023-12-19 LAB — COMPREHENSIVE METABOLIC PANEL WITH GFR
AG Ratio: 1.7 (calc) (ref 1.0–2.5)
ALT: 19 U/L (ref 9–46)
AST: 16 U/L (ref 10–35)
Albumin: 4.6 g/dL (ref 3.6–5.1)
Alkaline phosphatase (APISO): 53 U/L (ref 35–144)
BUN: 18 mg/dL (ref 7–25)
CO2: 26 mmol/L (ref 20–32)
Calcium: 9.9 mg/dL (ref 8.6–10.3)
Chloride: 101 mmol/L (ref 98–110)
Creat: 1.13 mg/dL (ref 0.70–1.35)
Globulin: 2.7 g/dL (ref 1.9–3.7)
Glucose, Bld: 157 mg/dL — ABNORMAL HIGH (ref 65–99)
Potassium: 4.5 mmol/L (ref 3.5–5.3)
Sodium: 136 mmol/L (ref 135–146)
Total Bilirubin: 0.5 mg/dL (ref 0.2–1.2)
Total Protein: 7.3 g/dL (ref 6.1–8.1)
eGFR: 74 mL/min/{1.73_m2} (ref >=60)

## 2023-12-19 NOTE — Progress Notes (Signed)
 Glucose is 157. Rest of CMP WNL.  CBC WNL.

## 2023-12-24 ENCOUNTER — Telehealth: Payer: Self-pay | Admitting: Rheumatology

## 2023-12-24 ENCOUNTER — Telehealth: Payer: Self-pay

## 2023-12-24 NOTE — Telephone Encounter (Signed)
Referral faxed, pending appt ?

## 2023-12-24 NOTE — Telephone Encounter (Signed)
 Bari from [facility] called stating they received a referral from us  that was for [facility]. She stated if we could refax the referral for their office at [phone]

## 2023-12-25 NOTE — Telephone Encounter (Signed)
 error

## 2024-01-03 ENCOUNTER — Encounter: Payer: Self-pay | Admitting: Pharmacist

## 2024-01-03 NOTE — Telephone Encounter (Signed)
 Error

## 2024-01-08 ENCOUNTER — Other Ambulatory Visit: Payer: Self-pay

## 2024-01-08 DIAGNOSIS — M450 Ankylosing spondylitis of multiple sites in spine: Secondary | ICD-10-CM

## 2024-01-08 DIAGNOSIS — H209 Unspecified iridocyclitis: Secondary | ICD-10-CM

## 2024-01-08 DIAGNOSIS — Z79899 Other long term (current) drug therapy: Secondary | ICD-10-CM

## 2024-01-08 MED ORDER — HUMIRA (2 PEN) 40 MG/0.4ML ~~LOC~~ AJKT: 40.0000 mg | AUTO-INJECTOR | SUBCUTANEOUS | 0 refills | Status: DC

## 2024-01-08 NOTE — Telephone Encounter (Signed)
 Last Fill: 11/01/2023  Labs: 12/18/2023 Glucose is 157. Rest of CMP WNL. CBC WNL.   TB Gold: 08/15/2023 TB Gold Negative   Next Visit: 05/12/2024  Last Visit: 12/18/2023  DX: Ankylosing spondylitis of multiple sites in spine   Current Dose per office note 12/18/2023: Humira  40 mg sq injections every other week   Okay to refill Humira ?

## 2024-03-02 ENCOUNTER — Ambulatory Visit: Admitting: Physician Assistant

## 2024-03-02 ENCOUNTER — Encounter: Payer: Self-pay | Admitting: Physician Assistant

## 2024-03-02 VITALS — BP 140/72 | HR 89 | Temp 97.1°F | Ht 70.5 in | Wt 172.0 lb

## 2024-03-02 DIAGNOSIS — E1140 Type 2 diabetes mellitus with diabetic neuropathy, unspecified: Secondary | ICD-10-CM | POA: Diagnosis not present

## 2024-03-02 DIAGNOSIS — Z794 Long term (current) use of insulin: Secondary | ICD-10-CM

## 2024-03-02 DIAGNOSIS — E782 Mixed hyperlipidemia: Secondary | ICD-10-CM

## 2024-03-02 DIAGNOSIS — N529 Male erectile dysfunction, unspecified: Secondary | ICD-10-CM

## 2024-03-02 DIAGNOSIS — K219 Gastro-esophageal reflux disease without esophagitis: Secondary | ICD-10-CM | POA: Diagnosis not present

## 2024-03-02 DIAGNOSIS — F5101 Primary insomnia: Secondary | ICD-10-CM | POA: Diagnosis not present

## 2024-03-02 MED ORDER — TADALAFIL 2.5 MG PO TABS: 2.5000 mg | ORAL_TABLET | Freq: Every day | ORAL | 3 refills | Status: DC

## 2024-03-02 NOTE — Progress Notes (Signed)
 Subjective:  Patient ID: Dale Curtis, male    DOB: [birth year]  Age: 63 y.o. MRN: [id number]  Chief Complaint  Patient presents with   New to establish    Patient is establishing as a new patient.  HPI  Discussed the use of AI scribe software for clinical note transcription with the patient, who gave verbal consent to proceed.  History of Present Illness Dale Curtis is a 63 year old male who presents to establish care.  He is establishing care after his previous doctor left seven months ago. He has a history of diabetes, ankylosing spondylitis, erectile dysfunction, sarcoidosis, elevated cholesterol, and elevated blood pressure. He is currently under the care of rheumatology, endocrinology, and psychiatry.  He has a history of low testosterone levels, previously managed with testosterone therapy, which he discontinued three years ago. Recent testosterone levels checked in May 2025 showed a total testosterone of 343 ng/dL. He notes low energy levels, and his urologist previously preferred levels around 900 ng/dL. He has not resumed testosterone therapy due to insurance and treatment guidelines.  He reports ankylosing spondylitis, stating his sacral joints have already fused and his neck is now straight. He is on Humira  for this condition. He avoids narcotic pain medications due to a family history of addiction and manages pain with 800 mg of ibuprofen as needed. He has a history of muscle atrophy in his right arm and reports he is unable to perform push-ups.  He has a history of elevated cholesterol, managed with rosuvastatin (Crestor) taken three times a week. 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 He retired eight years ago due to back pain.  He experiences sleep disturbances, using Ambien occasionally for insomnia. He reports intermittent sleep, waking every 30 minutes to an hour, about once or twice a week. He has a history of  early waking due to work but now sleeps until 6 AM.  He reports erectile dysfunction for the past year, with no improvement from medications like Cialis  or Viagra. He experiences burning in the scrotum post-orgasm, but no blood or abnormal discharge. He has not had any UTIs or STDs.  He is currently taking Vyvanse 70 mg for concentration issues, which has improved his focus. He notes that Adderall was ineffective for him.  He has a history of elevated blood pressure, with a recent reading of 140/72 mmHg. He wants to improve his diet, particularly reducing carbohydrate intake due to its impact on his blood sugar levels.        03/02/2024    3:36 PM  Depression screen PHQ 2/9  Decreased Interest 0  Down, Depressed, Hopeless 0  PHQ - 2 Score 0  Altered sleeping 1  Tired, decreased energy 3  Change in appetite 1  Feeling bad or failure about yourself  0  Trouble concentrating 1  Moving slowly or fidgety/restless 0  Suicidal thoughts 0  PHQ-9 Score 6  Difficult doing work/chores Not difficult at all         03/02/2024    3:36 PM  Fall Risk  Falls in the past year? 1  Was there an injury with Fall? 0  Fall Risk Category Calculator 1  Patient at Risk for Falls Due to No Fall Risks  Fall risk Follow up Falls evaluation completed     Current Outpatient Medications on File Prior to Visit  Medication Sig Dispense Refill   adalimumab  (HUMIRA , 2 PEN,) 40 MG/0.4ML pen Inject 0.4  mLs (40 mg total) into the skin every 14 (fourteen) days. 1 kit - 2 pens 6 each 0   aspirin 81 MG tablet Take 81 mg by mouth daily.     Continuous Glucose Sensor (DEXCOM G7 SENSOR) MISC by Does not apply route.     INVOKAMET XR [phone] MG TB24 Take 2 tablets by mouth daily.     LANTUS SOLOSTAR 100 UNIT/ML Solostar Pen Inject into the skin. (Patient taking differently: Inject 50 Units into the skin daily.)     lisdexamfetamine (VYVANSE) 50 MG capsule Take 50 mg by mouth every morning.     NOVOLOG FLEXPEN 100  UNIT/ML FlexPen SMARTSIG:5 Unit(s) SUB-Q Every Evening (Patient taking differently: Inject 8 Units into the skin daily.)     pantoprazole (PROTONIX) 40 MG tablet Take 40 mg by mouth daily.      rosuvastatin (CRESTOR) 5 MG tablet Take 5 mg by mouth.     sertraline (ZOLOFT) 100 MG tablet Take 200 mg by mouth daily.      traZODone (DESYREL) 150 MG tablet Take 150 mg by mouth at bedtime.     zolpidem (AMBIEN CR) 6.25 MG CR tablet Take by mouth.     No current facility-administered medications on file prior to visit.  . Social History   Socioeconomic History   Marital status: Married    Spouse name: Not on file   Number of children: 3   Years of education: Not on file   Highest education level: Not on file  Occupational History   Not on file  Tobacco Use   Smoking status: Former    Current packs/day: 1.00    Average packs/day: 1 pack/day for 3.0 years (3.0 ttl pk-yrs)    Types: Cigarettes   Smokeless tobacco: Never  Vaping Use   Vaping status: Never Used  Substance and Sexual Activity   Alcohol use: Yes    Comment: occasional   Drug use: No   Sexual activity: Not on file  Other Topics Concern   Not on file  Social History Narrative   Not on file   Social Drivers of Health   Financial Resource Strain: Patient Declined (03/14/2023)   Received from [facility]   Overall Financial Resource Strain (CARDIA)    Difficulty of Paying Living Expenses: Patient declined  Food Insecurity: Patient Declined (03/14/2023)   Received from [facility]   Hunger Vital Sign    Within the past 12 months, you worried that your food would run out before you got the money to buy more.: Patient declined    Within the past 12 months, the food you bought just didn't last and you didn't have money to get more.: Patient declined  Transportation Needs: No Transportation Needs (03/14/2023)   Received from [facility] - Transportation    Lack of Transportation (Medical): No    Lack of  Transportation (Non-Medical): No  Physical Activity: Unknown (03/14/2023)   Received from [facility]   Exercise Vital Sign    On average, how many days per week do you engage in moderate to strenuous exercise (like a brisk walk)?: 3 days    Minutes of Exercise per Session: Not on file  Stress: No Stress Concern Present (03/14/2023)   Received from [facility] - Occupational Stress Questionnaire    Feeling of Stress : Only a little  Social Connections: Moderately Integrated (03/14/2023)   Received from [facility]   Social Network  How would you rate your social network (family, work, friends)?: Adequate participation with social networks   Past Medical History:  Diagnosis Date   Ankylosing spondylitis (HCC)    Bipolar disorder (HCC)    Diabetes (HCC)    Sarcoidosis    In Remition   Ulcerative colitis (HCC)    Family History  Problem Relation Age of Onset   COPD Mother    Emphysema Mother    Heart disease Mother    Heart disease Father    COPD Father    Bipolar disorder Sister    Ankylosing spondylitis Daughter     Review of Systems  Constitutional:  Negative for appetite change, fatigue and fever.  HENT:  Negative for congestion, ear pain, sinus pressure and sore throat.   Respiratory:  Negative for cough, chest tightness, shortness of breath and wheezing.   Cardiovascular:  Negative for chest pain and palpitations.  Gastrointestinal:  Negative for abdominal pain, constipation, diarrhea, nausea and vomiting.  Genitourinary:  Negative for dysuria and hematuria.  Musculoskeletal:  Negative for arthralgias, back pain, joint swelling and myalgias.  Skin:  Negative for rash.  Neurological:  Negative for dizziness, weakness and headaches.  Psychiatric/Behavioral:  Negative for dysphoric mood. The patient is not nervous/anxious.      Objective:  BP (!) 140/72 (BP Location: Left Arm, Patient Position: Sitting)   Pulse 89    Temp (!) 97.1 F (36.2 C) (Temporal)   Ht 5' 10.5 (1.791 m)   Wt 172 lb (78 kg)   SpO2 98%   BMI 24.33 kg/m      03/02/2024    3:44 PM 12/18/2023    3:07 PM 09/11/2023    3:21 PM  BP/Weight  Systolic BP 140 123 127  Diastolic BP 72 81 81  Wt. (Lbs) 172 180 177  BMI 24.33 kg/m2 25.46 kg/m2 25.04 kg/m2    Physical Exam Constitutional:      Appearance: Normal appearance.  HENT:     Right Ear: Tympanic membrane normal.     Left Ear: Tympanic membrane normal. There is impacted cerumen.     Nose: Nose normal.     Mouth/Throat:     Pharynx: No oropharyngeal exudate or posterior oropharyngeal erythema.  Eyes:     Conjunctiva/sclera: Conjunctivae normal.  Neck:     Vascular: No carotid bruit.  Cardiovascular:     Rate and Rhythm: Normal rate and regular rhythm.     Heart sounds: Normal heart sounds.  Pulmonary:     Effort: Pulmonary effort is normal.     Breath sounds: Normal breath sounds.  Abdominal:     General: Bowel sounds are normal.     Palpations: Abdomen is soft.     Tenderness: There is no abdominal tenderness.  Skin:    Findings: No lesion or rash.  Neurological:     Mental Status: He is alert and oriented to person, place, and time.  Psychiatric:        Behavior: Behavior normal.    Diabetic Foot Exam - Simple   No data filed      Lab Results  Component Value Date   WBC 7.2 12/18/2023   HGB 15.6 12/18/2023   HCT 46.0 12/18/2023   PLT 376 12/18/2023   GLUCOSE 157 (H) 12/18/2023   ALT 19 12/18/2023   AST 16 12/18/2023   NA 136 12/18/2023   K 4.5 12/18/2023   CL 101 12/18/2023   CREATININE 1.13 12/18/2023   BUN 18 12/18/2023  CO2 26 12/18/2023      Assessment & Plan:  Assessment and Plan Assessment & Plan Establishment of Care Establishing care with new provider. Under rheumatology, endocrinology, and psychiatry care for ongoing conditions.  Ankylosing spondylitis with right upper extremity muscle atrophy Chronic condition with sacral  joint fusion and cervical spine straightening. Right upper extremity muscle atrophy. Managed with Humira . Avoids pain medications due to family history of addiction. Uses ibuprofen as needed. Rheumatology managing condition well.  Primary insomnia Intermittent insomnia, uses Ambien as needed. Engages in physical activity to aid sleep.  Male erectile dysfunction Erectile dysfunction unresponsive to Cialis  and Viagra. Considering testosterone therapy. Reports post-orgasm scrotal burning. Discussed tadalafil  benefits and side effects. - Order PSA test. - Order testosterone level test. - Prescribe tadalafil  2.5 mg daily. - Reassess erectile dysfunction treatment after lab results.  Mixed hyperlipidemia Elevated cholesterol managed with rosuvastatin. Reports muscle aches possibly related to medication. - Order cholesterol panel. - Continue rosuvastatin 3 times a week.  Essential hypertension Blood pressure slightly elevated at 140/72. Discussed dietary influences and Vyvanse impact. Monitoring advised. - Recheck blood pressure. - Order thyroid function test.  Sensorineural symptoms of feet Occasional toe sensation possibly related to ankylosing spondylitis. No significant numbness or tingling.  General Health Maintenance Discussion on keeping screenings and vaccinations up to date.  Follow-Up Plans for follow-up lab work and future appointments discussed. - Schedule lab work for Tuesday, September 16th at 7:45 AM. - Schedule follow-up appointment in three months.  Recording duration: 40 minutes   There are no diagnoses linked to this encounter.   No orders of the defined types were placed in this encounter.    No orders of the defined types were placed in this encounter.     I,Lauren M Auman,acting as a Neurosurgeon for [employer], [employer].,have documented all relevant documentation on the behalf of Nola Angles, PA,as directed by  Nola Angles, PA while in the presence of Nola Angles,  [place].    Follow-up: No follow-ups on file.  AVS was given to patient prior to departure.  Nola Angles, [place] [facility] [phone]

## 2024-03-10 ENCOUNTER — Other Ambulatory Visit

## 2024-03-10 DIAGNOSIS — E782 Mixed hyperlipidemia: Secondary | ICD-10-CM

## 2024-03-10 DIAGNOSIS — N529 Male erectile dysfunction, unspecified: Secondary | ICD-10-CM

## 2024-03-15 LAB — LIPID PANEL
Chol/HDL Ratio: 4.8 ratio (ref 0.0–5.0)
Cholesterol, Total: 232 mg/dL — ABNORMAL HIGH (ref 100–199)
HDL: 48 mg/dL (ref >39)
LDL Chol Calc (NIH): 147 mg/dL — ABNORMAL HIGH (ref 0–99)
Triglycerides: 203 mg/dL — ABNORMAL HIGH (ref 0–149)
VLDL Cholesterol Cal: 37 mg/dL (ref 5–40)

## 2024-03-15 LAB — TESTOSTERONE,FREE AND TOTAL
Testosterone, Free: 4.3 pg/mL — ABNORMAL LOW (ref 6.6–18.1)
Testosterone: 339 ng/dL (ref 264–916)

## 2024-03-15 LAB — PSA: Prostate Specific Ag, Serum: 0.6 ng/mL (ref 0.0–4.0)

## 2024-03-15 LAB — T4, FREE: Free T4: 1.13 ng/dL (ref 0.82–1.77)

## 2024-03-15 LAB — TSH: TSH: 1.85 u[IU]/mL (ref 0.450–4.500)

## 2024-03-16 ENCOUNTER — Ambulatory Visit: Payer: Self-pay | Admitting: Physician Assistant

## 2024-03-16 DIAGNOSIS — E291 Testicular hypofunction: Secondary | ICD-10-CM

## 2024-03-16 DIAGNOSIS — N529 Male erectile dysfunction, unspecified: Secondary | ICD-10-CM

## 2024-04-01 NOTE — Progress Notes (Deleted)
    Chief Complaint:   History of Present Illness:  Dale Curtis is a 63 y.o. male who is seen in consultation from [place], [place] for evaluation of erectile dysfunction.   Past Medical History:  Past Medical History:  Diagnosis Date   Ankylosing spondylitis (HCC)    Bipolar disorder (HCC)    Diabetes (HCC)    Hyperlipidemia    Sarcoidosis    In Remition   Ulcerative colitis (HCC)     Past Surgical History:  Past Surgical History:  Procedure Laterality Date   BRONCHOSCOPY     HERNIA REPAIR  2014    Allergies:  No Known Allergies  Family History:  Family History  Problem Relation Age of Onset   COPD Mother    Emphysema Mother    Heart disease Mother    Heart disease Father    COPD Father    Bipolar disorder Sister    Ankylosing spondylitis Daughter     Social History:  Social History   Tobacco Use   Smoking status: Former    Current packs/day: 1.00    Average packs/day: 1 pack/day for 3.0 years (3.0 ttl pk-yrs)    Types: Cigarettes   Smokeless tobacco: Never  Vaping Use   Vaping status: Never Used  Substance Use Topics   Alcohol use: Yes    Comment: occasional   Drug use: No    Review of symptoms:  Constitutional:  Negative for unexplained weight loss, night sweats, fever, chills ENT:  Negative for nose bleeds, sinus pain, painful swallowing CV:  Negative for chest pain, shortness of breath, exercise intolerance, palpitations, loss of consciousness Resp:  Negative for cough, wheezing, shortness of breath GI:  Negative for nausea, vomiting, diarrhea, bloody stools GU:  Positives noted in HPI; otherwise negative for gross hematuria, dysuria, urinary incontinence Neuro:  Negative for seizures, poor balance, limb weakness, slurred speech Psych:  Negative for lack of energy, depression, anxiety Endocrine:  Negative for polydipsia, polyuria, symptoms of hypoglycemia (dizziness, hunger, sweating) Hematologic:  Negative for anemia, purpura, petechia,  prolonged or excessive bleeding, use of anticoagulants  Allergic:  Negative for difficulty breathing or choking as a result of exposure to anything; no shellfish allergy; no allergic response (rash/itch) to materials, foods  Physical exam: There were no vitals taken for this visit. GENERAL APPEARANCE:  Well appearing, well developed, well nourished, NAD HEENT: Atraumatic, Normocephalic. NECK: Normal appearance LUNGS: Normal inspiratory and expiratory excursion HEART: Regular Rate ABDOMEN: ***. GU: Phallus normal, no lesions. Scrotal skin normal. Testicles/epididymal structures normal. Meatus normal. Normal anal sphincter tone, prostate ***mL, symmetric, non nodular, non tender. EXTREMITIES: Moves all extremities well.  Without clubbing, cyanosis, or edema. NEUROLOGIC:  Alert and oriented x 3, normal gait, CN II-XII grossly intact.  MENTAL STATUS:  Appropriate. SKIN:  Warm, dry and intact.    Results: No results found for this or any previous visit (from the past 24 hours).  I have reviewed referring/prior physicians notes  I have reviewed urinalysis  I have reviewed PSA results  I have reviewed prior imaging  I have reviewed urine culture results  Assessment: ***   Plan: ***

## 2024-04-03 ENCOUNTER — Telehealth: Payer: Self-pay | Admitting: Urology

## 2024-04-03 NOTE — Telephone Encounter (Signed)
 called and lvm to r/s time due to dahlstedt being in surgery 10:00 am ANN 04/03/24

## 2024-04-06 ENCOUNTER — Ambulatory Visit: Admitting: Urology

## 2024-04-06 DIAGNOSIS — N5201 Erectile dysfunction due to arterial insufficiency: Secondary | ICD-10-CM

## 2024-04-08 ENCOUNTER — Telehealth: Payer: Self-pay

## 2024-04-08 NOTE — Telephone Encounter (Signed)
 Received fax from [facility] stating that pt's current PA will expire prior to next fill.  Submitted a Prior Authorization request to [employer] for HUMIRA  via CoverMyMeds. Will update once we receive a response.  Key: [id number]

## 2024-04-09 NOTE — Telephone Encounter (Signed)
 CMM response shows: Invalid Ordering Organization ID  Renewed Humira  PA key - hopefully this PA processes successfully  Key: [id number]  Sherry Pennant, PharmD, MPH, BCPS, CPP Clinical Pharmacist [facility])

## 2024-04-10 NOTE — Telephone Encounter (Signed)
 Received fax from [facility] stating that no authorization is required for Humira . Request has been cancelled.  Sherry Pennant, PharmD, MPH, BCPS, CPP Clinical Pharmacist [facility])

## 2024-04-19 NOTE — Progress Notes (Incomplete)
    Chief Complaint:   History of Present Illness:  Dale Curtis is a 63 y.o. male who is seen in consultation from [place], [place] for evaluation of erectile dysfunction.   Past Medical History:  Past Medical History:  Diagnosis Date   Ankylosing spondylitis (HCC)    Bipolar disorder (HCC)    Diabetes (HCC)    Hyperlipidemia    Sarcoidosis    In Remition   Ulcerative colitis (HCC)     Past Surgical History:  Past Surgical History:  Procedure Laterality Date   BRONCHOSCOPY     HERNIA REPAIR  2014    Allergies:  No Known Allergies  Family History:  Family History  Problem Relation Age of Onset   COPD Mother    Emphysema Mother    Heart disease Mother    Heart disease Father    COPD Father    Bipolar disorder Sister    Ankylosing spondylitis Daughter     Social History:  Social History   Tobacco Use   Smoking status: Former    Current packs/day: 1.00    Average packs/day: 1 pack/day for 3.0 years (3.0 ttl pk-yrs)    Types: Cigarettes   Smokeless tobacco: Never  Vaping Use   Vaping status: Never Used  Substance Use Topics   Alcohol use: Yes    Comment: occasional   Drug use: No    Review of symptoms:  Constitutional:  Negative for unexplained weight loss, night sweats, fever, chills ENT:  Negative for nose bleeds, sinus pain, painful swallowing CV:  Negative for chest pain, shortness of breath, exercise intolerance, palpitations, loss of consciousness Resp:  Negative for cough, wheezing, shortness of breath GI:  Negative for nausea, vomiting, diarrhea, bloody stools GU:  Positives noted in HPI; otherwise negative for gross hematuria, dysuria, urinary incontinence Neuro:  Negative for seizures, poor balance, limb weakness, slurred speech Psych:  Negative for lack of energy, depression, anxiety Endocrine:  Negative for polydipsia, polyuria, symptoms of hypoglycemia (dizziness, hunger, sweating) Hematologic:  Negative for anemia, purpura, petechia,  prolonged or excessive bleeding, use of anticoagulants  Allergic:  Negative for difficulty breathing or choking as a result of exposure to anything; no shellfish allergy; no allergic response (rash/itch) to materials, foods  Physical exam: There were no vitals taken for this visit. GENERAL APPEARANCE:  Well appearing, well developed, well nourished, NAD HEENT: Atraumatic, Normocephalic. NECK: Normal appearance LUNGS: Normal inspiratory and expiratory excursion HEART: Regular Rate ABDOMEN: ***. GU: Phallus normal, no lesions. Scrotal skin normal. Testicles/epididymal structures normal. Meatus normal. Normal anal sphincter tone, prostate ***mL, symmetric, non nodular, non tender. EXTREMITIES: Moves all extremities well.  Without clubbing, cyanosis, or edema. NEUROLOGIC:  Alert and oriented x 3, normal gait, CN II-XII grossly intact.  MENTAL STATUS:  Appropriate. SKIN:  Warm, dry and intact.    Results: No results found for this or any previous visit (from the past 24 hours).  I have reviewed referring/prior physicians notes  I have reviewed urinalysis  I have reviewed PSA results  I have reviewed prior imaging  I have reviewed urine culture results  Assessment: ***   Plan: ***

## 2024-04-20 ENCOUNTER — Ambulatory Visit: Admitting: Urology

## 2024-04-20 DIAGNOSIS — N5201 Erectile dysfunction due to arterial insufficiency: Secondary | ICD-10-CM
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
## 2024-04-30 ENCOUNTER — Encounter: Payer: Self-pay | Admitting: Physician Assistant

## 2024-05-05 ENCOUNTER — Telehealth: Payer: Self-pay

## 2024-05-05 NOTE — Telephone Encounter (Signed)
 Received a fax from [facility] stating they were trying to reach the patient about delivering his Humira . Contacted the patient and advised the patient of the phone number for [facility] and patient states he will give them a call.

## 2024-05-11 NOTE — Progress Notes (Signed)
 Subjective   Patient ID: Dale Curtis is a 63 y.o. (DOB [birth year]) male  History of Present Illness: This is a 63 y.o. male who returns for follow-up of type 2 diabetes.  He was diagnosed with diabetes around 2007. His diabetes is complicated by peripheral neuropathy.     In December 2022, we added ozempic and lowered tresiba. He was tolerating the ozempic with some occasional waves of nausea.    Patient was able to re-start his Invokamet. He was unable to get it due to increased copay. He has met his deductible and pays a zero copay. He has also been eating sweet snacks in the evening causing the evening spikes in glucose.   A1c 9.7% on 05/11/24, 8.0% on 11/19/23, 9.5% on 08/15/23, 7.9% on 05/14/23, 13.2% on 01/07/23, 8.7% on 09/28/22, 6.7% on 07/04/22, 9.5% on 03/20/22, 8.2% on 12/13/21, 7.8% on 08/30/21, 8.8% on 02/01/21, 8.7% on 10/26/20, and 9.7% on 07/20/20.    Patient confirms taking diabetes medications as follows:  Lantus 50 units qAM  Novolog 5-6 units with supper  Increased to 14 units  Invokamet 150-1000mg  #2 tablets QD       Weight trend:  Body mass index is 23.99 kg/m.  Wt Readings from Last 3 Encounters:  05/11/24 172 lb (78 kg)  11/19/23 183 lb (83 kg)  08/15/23 176 lb (79.8 kg)    Retinopathy: No historical documentation  Neuropathy: Neuropathy  Nephropathy: No historical documentation    Cardiovascular disease: No historical documentation  Cerebrovascular disease: No historical documentation  Peripheral vascular disease: No historical documentation    Most recent retinal exam:                        02/11/20 Most recent LDL cholesterol:                  147mg /dL (90/78/74)              Taking statin?:                             Rosuvastatin 10mg   Most recent urine microalbumin/Cr ratio: 19mg /g (09/28/22)              Taking ACEI/ARB?:                     No  Taking daily ASA?:                                   Yes  Current smoker?:                                       No  Discussed smoking cessation?:               N/A      Reviewed and updated this visit by provider: Tobacco  Allergies  Meds  Problems  Med Hx  Surg Hx  Fam Hx      ROS Eight systems were reviewed, and pertinent positives and negatives are as discussed in HPI.  Objective   Vitals:   05/11/24 1616  BP: 124/76  Height: 5' 11 (1.803 m)  Weight: 172 lb (78 kg)  BMI (Calculated): 24    Physical Exam     Laboratory data  Lab Results  Component Value Date   Hemoglobin A1c 9.7 (A) 05/11/2024   Hemoglobin A1c 8.0 (A) 11/19/2023   Hemoglobin A1c 9.5 (A) 08/15/2023   Results for orders placed or performed in visit on 09/28/22  Albumin/Creatinine Ratio, Random Urine  Result Value Ref Range   Creatinine, Urine 35.3 Not Estab. mg/dL   Albumin, Urine 6.6 Not Estab. ug/mL   Alb/Creat Ratio 19 0 - 29 mg/g creat   Narrative   Performed at:  [address] [employer] [address], [place], [place]  [id number] Lab Director: Frankey Sas MD, Phone:  [phone]  Results for orders placed or performed in visit on 05/10/21  POCT ACR URINE  Result Value Ref Range   A/C RATIO UR, POC 42.2 (A) 0.0 - 29.9 mg/g   Albumin 27.0 0.0 - 29.9 mg/L   CREATININE UR, POC 64.0 34.0 - 147.0 mg/dL   Lab Results  Component Value Date/Time   Glucose 208 (H) 09/28/2022 10:27 AM   BUN 18 09/28/2022 10:27 AM   Creatinine 0.88 09/28/2022 10:27 AM   eGFR 99 09/28/2022 10:27 AM   Sodium 136 09/28/2022 10:27 AM   Potassium 4.2 09/28/2022 10:27 AM   Chloride 99 09/28/2022 10:27 AM   CO2 24 09/28/2022 10:27 AM   CALCIUM 9.8 09/28/2022 10:27 AM   Total Protein 7.2 09/28/2022 10:27 AM   Albumin, Serum 4.6 09/28/2022 10:27 AM   Globulin, Total 2.6 09/28/2022 10:27 AM   Albumin/Globulin Ratio 1.8 09/28/2022 10:27 AM   Total Bilirubin 0.3 09/28/2022 10:27 AM   Alkaline Phosphatase 81 09/28/2022 10:27 AM   AST 11 09/28/2022 10:27 AM   ALT (SGPT) 15 09/28/2022 10:27 AM    Lab  Results  Component Value Date/Time   Cholesterol, Total 234 (H) 09/28/2022 10:27 AM   Triglycerides 108 09/28/2022 10:27 AM   HDL 46 09/28/2022 10:27 AM   VLDL Cholesterol Cal 19 09/28/2022 10:27 AM   LDL 169 (H) 09/28/2022 10:27 AM    Lab Results  Component Value Date/Time   TSH 1.320 11/06/2019 04:27 PM     Assessment and Plan   1. Uncontrolled type 1 diabetes mellitus with hyperglycemia, with long-term current use of insulin (*) (Primary) 2. Type 2 diabetes mellitus with diabetic neuropathy, with long-term current use of insulin (*) -     POCT Hemoglobin A1C -     Insulin Disposable Pump (OMNIPOD 5 G7 INTRO, GEN 5,) KIT; 1 kit by Does not apply route once for 1 dose., Starting Mon 05/11/2024, Normal -     Insulin Disposable Pump (OMNIPOD 5 [id number] PODS GEN 5) MISC; Place one system onto the skin every 3 (three) days., Starting Mon 05/11/2024, Normal 3. Uses self-applied continuous glucose monitoring device  Discussion & Recommendations A1c increased to 9.7%. Patient reports unhealthy dietary behaviors since last OV. We discussed HCL pump therapy and he is hesitant but open to giving it a chance if insurance coverage is adequate. Omnipod 5 intro kit and pods sent to pharmacy. In the interim, he has been advised to adjust insulin dosing.   Patient Instructions A1c 9.7% on 05/11/24  INCREASE Lantus to 60 units every morning Continue Novolog 14 units with supper  May have to reduce this once you start seeing improvements in your glucoses in the next 3 days  Continue Invokamet 150-1000mg  #2 tablets daily  Continue using your Dexcom to monitor glucose levels  Omnipod 5 intro kit and pods sent to your pharmacy  Return to clinic in 3 months  Follow up in about 3 months (around 08/11/2024) for T2D (Dexcom); 20 min.   Documentation for time-based billing:  Total time spent of date of service was 30 minutes.  Patient care activities included preparing to see the patient such as  reviewing the patient record, obtaining and/or reviewing separately obtained history, performing a medically appropriate history and physical examination, counseling and educating the patient, family, and/or caregiver, ordering prescription medications, tests, or procedures, referring and communicating with other health care providers when not separately reported during the visit, documenting clinical information in the electronic or other health record, independently interpreting results when not separately reported, communicating results to the patient/family/caregiver, and coordinating the care of the patient when not separately reported.     Delon CINDERELLA Nigh, NP 05/11/2024, 4:53 PM

## 2024-05-12 ENCOUNTER — Ambulatory Visit: Attending: Rheumatology | Admitting: Rheumatology

## 2024-05-12 ENCOUNTER — Encounter: Payer: Self-pay | Admitting: Rheumatology

## 2024-05-12 VITALS — BP 123/82 | HR 90 | Temp 97.9°F | Resp 17 | Ht 70.5 in | Wt 174.8 lb

## 2024-05-12 DIAGNOSIS — M5030 Other cervical disc degeneration, unspecified cervical region: Secondary | ICD-10-CM

## 2024-05-12 DIAGNOSIS — Z1589 Genetic susceptibility to other disease: Secondary | ICD-10-CM

## 2024-05-12 DIAGNOSIS — M25562 Pain in left knee: Secondary | ICD-10-CM

## 2024-05-12 DIAGNOSIS — M19041 Primary osteoarthritis, right hand: Secondary | ICD-10-CM

## 2024-05-12 DIAGNOSIS — Z8659 Personal history of other mental and behavioral disorders: Secondary | ICD-10-CM

## 2024-05-12 DIAGNOSIS — M19042 Primary osteoarthritis, left hand: Secondary | ICD-10-CM

## 2024-05-12 DIAGNOSIS — Z79899 Other long term (current) drug therapy: Secondary | ICD-10-CM

## 2024-05-12 DIAGNOSIS — M25511 Pain in right shoulder: Secondary | ICD-10-CM

## 2024-05-12 DIAGNOSIS — H209 Unspecified iridocyclitis: Secondary | ICD-10-CM | POA: Diagnosis not present

## 2024-05-12 DIAGNOSIS — M5134 Other intervertebral disc degeneration, thoracic region: Secondary | ICD-10-CM

## 2024-05-12 DIAGNOSIS — D869 Sarcoidosis, unspecified: Secondary | ICD-10-CM

## 2024-05-12 DIAGNOSIS — Z87891 Personal history of nicotine dependence: Secondary | ICD-10-CM

## 2024-05-12 DIAGNOSIS — K5120 Ulcerative (chronic) proctitis without complications: Secondary | ICD-10-CM

## 2024-05-12 DIAGNOSIS — R479 Unspecified speech disturbances: Secondary | ICD-10-CM

## 2024-05-12 DIAGNOSIS — K219 Gastro-esophageal reflux disease without esophagitis: Secondary | ICD-10-CM

## 2024-05-12 DIAGNOSIS — M461 Sacroiliitis, not elsewhere classified: Secondary | ICD-10-CM

## 2024-05-12 DIAGNOSIS — M47816 Spondylosis without myelopathy or radiculopathy, lumbar region: Secondary | ICD-10-CM

## 2024-05-12 DIAGNOSIS — E782 Mixed hyperlipidemia: Secondary | ICD-10-CM

## 2024-05-12 DIAGNOSIS — Z8639 Personal history of other endocrine, nutritional and metabolic disease: Secondary | ICD-10-CM

## 2024-05-12 DIAGNOSIS — M450 Ankylosing spondylitis of multiple sites in spine: Secondary | ICD-10-CM | POA: Diagnosis not present

## 2024-05-12 DIAGNOSIS — R413 Other amnesia: Secondary | ICD-10-CM

## 2024-05-12 DIAGNOSIS — G8929 Other chronic pain: Secondary | ICD-10-CM

## 2024-05-12 MED ORDER — HUMIRA (2 PEN) 40 MG/0.4ML ~~LOC~~ AJKT: 40.0000 mg | AUTO-INJECTOR | SUBCUTANEOUS | 0 refills | Status: AC

## 2024-05-12 NOTE — Patient Instructions (Signed)
 Standing Labs We placed an order today for your standing lab work.   Please have your standing labs drawn in February and every 3 months  Please have your labs drawn 2 weeks prior to your appointment so that the provider can discuss your lab results at your appointment, if possible.  Please note that you may see your imaging and lab results in MyChart before we have reviewed them. We will contact you once all results are reviewed. Please allow our office up to 72 hours to thoroughly review all of the results before contacting the office for clarification of your results.  WALK-IN LAB HOURS  Monday through Thursday from 8:00 am -12:30 pm and 1:00 pm-4:30 pm and Friday from 8:00 am-12:00 pm.  Patients with office visits requiring labs will be seen before walk-in labs.  You may encounter longer than normal wait times. Please allow additional time. Wait times may be shorter on  Monday and Thursday afternoons.  We do not book appointments for walk-in labs. We appreciate your patience and understanding with our staff.   Labs are drawn by [facility]. Please bring your co-pay at the time of your lab draw.  You may receive a bill from [facility] for your lab work.  Please note if you are on Hydroxychloroquine and and an order has been placed for a Hydroxychloroquine level,  you will need to have it drawn 4 hours or more after your last dose.  If you wish to have your labs drawn at another location, please call the office 24 hours in advance so we can fax the orders.  The office is located at [address], [place], [place] [address]   If you have any questions regarding directions or hours of operation,  please call [phone].   As a reminder, please drink plenty of water prior to coming for your lab work. Thanks!   Vaccines You are taking a medication(s) that can suppress your immune system.  The following immunizations are recommended: Flu annually RSV Covid-19  Td/Tdap (tetanus,  diphtheria, pertussis) every 10 years Pneumonia (Prevnar 15 then Pneumovax 23 at least 1 year apart.  Alternatively, can take Prevnar 20 without needing additional dose) Shingrix: 2 doses from 4 weeks to 6 months apart  Please check with your PCP to make sure you are up to date.   If you have signs or symptoms of an infection or start antibiotics: First, call your PCP for workup of your infection. Hold your medication through the infection, until you complete your antibiotics, and until symptoms resolve if you take the following: Injectable medication (Actemra, Benlysta, Cimzia, Cosentyx, Enbrel, Humira , Kevzara, Orencia, Remicade, Simponi, Stelara, Taltz, Tremfya) Methotrexate  Leflunomide (Arava) Mycophenolate (Cellcept) Xeljanz, Olumiant, or Rinvoq   Please get an annual skin examination to screen for skin cancer while you are on Humira .  Please use sunscreen and sun protection.

## 2024-05-13 ENCOUNTER — Ambulatory Visit: Payer: Self-pay | Admitting: Rheumatology

## 2024-05-13 LAB — COMPREHENSIVE METABOLIC PANEL WITH GFR
AG Ratio: 2 (calc) (ref 1.0–2.5)
ALT: 15 U/L (ref 9–46)
AST: 13 U/L (ref 10–35)
Albumin: 4.5 g/dL (ref 3.6–5.1)
Alkaline phosphatase (APISO): 45 U/L (ref 35–144)
BUN: 12 mg/dL (ref 7–25)
CO2: 27 mmol/L (ref 20–32)
Calcium: 9.5 mg/dL (ref 8.6–10.3)
Chloride: 101 mmol/L (ref 98–110)
Creat: 1.01 mg/dL (ref 0.70–1.35)
Globulin: 2.3 g/dL (ref 1.9–3.7)
Glucose, Bld: 341 mg/dL — ABNORMAL HIGH (ref 65–99)
Potassium: 4.5 mmol/L (ref 3.5–5.3)
Sodium: 135 mmol/L (ref 135–146)
Total Bilirubin: 0.5 mg/dL (ref 0.2–1.2)
Total Protein: 6.8 g/dL (ref 6.1–8.1)
eGFR: 85 mL/min/1.73m2 (ref >=60)

## 2024-05-13 LAB — CBC WITH DIFFERENTIAL/PLATELET
Absolute Lymphocytes: 1325 {cells}/uL (ref 850–3900)
Absolute Monocytes: 518 {cells}/uL (ref 200–950)
Basophils Absolute: 58 {cells}/uL (ref 0–200)
Basophils Relative: 0.9 %
Eosinophils Absolute: 198 {cells}/uL (ref 15–500)
Eosinophils Relative: 3.1 %
HCT: 42.3 % (ref 38.5–50.0)
Hemoglobin: 14.2 g/dL (ref 13.2–17.1)
MCH: 30.5 pg (ref 27.0–33.0)
MCHC: 33.6 g/dL (ref 32.0–36.0)
MCV: 90.8 fL (ref 80.0–100.0)
MPV: 10.3 fL (ref 7.5–12.5)
Monocytes Relative: 8.1 %
Neutro Abs: 4301 {cells}/uL (ref 1500–7800)
Neutrophils Relative %: 67.2 %
Platelets: 350 Thousand/uL (ref 140–400)
RBC: 4.66 Million/uL (ref 4.20–5.80)
RDW: 12.3 % (ref 11.0–15.0)
Total Lymphocyte: 20.7 %
WBC: 6.4 Thousand/uL (ref 3.8–10.8)

## 2024-05-13 NOTE — Progress Notes (Signed)
 CBC normal.  Glucose is elevated at 341.  Please notify patient and forward results to his PCP.  Patient should contact his PCP regarding elevated glucose.

## 2024-06-10 ENCOUNTER — Ambulatory Visit: Admitting: Physician Assistant

## 2024-07-13 ENCOUNTER — Other Ambulatory Visit: Payer: Self-pay | Admitting: Physician Assistant

## 2024-07-13 DIAGNOSIS — N529 Male erectile dysfunction, unspecified: Secondary | ICD-10-CM

## 2024-09-14 ENCOUNTER — Ambulatory Visit: Admitting: Dermatology

## 2024-10-14 ENCOUNTER — Ambulatory Visit: Admitting: Rheumatology
